# Patient Record
Sex: Female | Born: 1935 | Race: White | Hispanic: No | Smoking: Never smoker
Health system: Southern US, Community
[De-identification: ages and names within clinical notes are randomized; demographics above are authoritative.]

## PROBLEM LIST (undated history)

## (undated) DIAGNOSIS — G20A1 Parkinson's disease without dyskinesia, without mention of fluctuations: Secondary | ICD-10-CM

## (undated) DIAGNOSIS — G20 Parkinson's disease: Secondary | ICD-10-CM

## (undated) DIAGNOSIS — F0390 Unspecified dementia without behavioral disturbance: Secondary | ICD-10-CM

---

## 2001-10-21 ENCOUNTER — Encounter: Payer: Self-pay | Admitting: Emergency Medicine

## 2001-10-21 ENCOUNTER — Emergency Department (HOSPITAL_COMMUNITY): Admission: EM | Admit: 2001-10-21 | Discharge: 2001-10-21 | Payer: Self-pay | Admitting: Emergency Medicine

## 2002-03-14 ENCOUNTER — Encounter: Payer: Self-pay | Admitting: Emergency Medicine

## 2002-03-14 ENCOUNTER — Emergency Department (HOSPITAL_COMMUNITY): Admission: EM | Admit: 2002-03-14 | Discharge: 2002-03-14 | Payer: Self-pay | Admitting: Emergency Medicine

## 2003-03-09 ENCOUNTER — Emergency Department (HOSPITAL_COMMUNITY): Admission: EM | Admit: 2003-03-09 | Discharge: 2003-03-10 | Payer: Self-pay | Admitting: Emergency Medicine

## 2003-03-09 ENCOUNTER — Encounter: Payer: Self-pay | Admitting: Emergency Medicine

## 2003-04-08 ENCOUNTER — Ambulatory Visit (HOSPITAL_COMMUNITY): Admission: RE | Admit: 2003-04-08 | Discharge: 2003-04-09 | Payer: Self-pay | Admitting: Orthopedic Surgery

## 2003-04-08 ENCOUNTER — Encounter: Payer: Self-pay | Admitting: Orthopedic Surgery

## 2006-06-10 ENCOUNTER — Encounter: Admission: RE | Admit: 2006-06-10 | Discharge: 2006-07-10 | Payer: Self-pay | Admitting: Neurology

## 2006-06-15 ENCOUNTER — Encounter: Admission: RE | Admit: 2006-06-15 | Discharge: 2006-06-15 | Payer: Self-pay | Admitting: Internal Medicine

## 2006-07-11 ENCOUNTER — Encounter: Admission: RE | Admit: 2006-07-11 | Discharge: 2006-08-09 | Payer: Self-pay | Admitting: Neurology

## 2006-08-10 ENCOUNTER — Encounter: Admission: RE | Admit: 2006-08-10 | Discharge: 2006-09-13 | Payer: Self-pay | Admitting: Neurology

## 2006-10-25 ENCOUNTER — Encounter: Admission: RE | Admit: 2006-10-25 | Discharge: 2006-11-23 | Payer: Self-pay | Admitting: Neurology

## 2006-11-24 ENCOUNTER — Encounter: Admission: RE | Admit: 2006-11-24 | Discharge: 2006-12-21 | Payer: Self-pay | Admitting: Neurology

## 2007-06-22 ENCOUNTER — Encounter: Admission: RE | Admit: 2007-06-22 | Discharge: 2007-09-20 | Payer: Self-pay | Admitting: Neurology

## 2007-07-04 ENCOUNTER — Encounter: Admission: RE | Admit: 2007-07-04 | Discharge: 2007-07-04 | Payer: Self-pay | Admitting: Internal Medicine

## 2007-07-24 ENCOUNTER — Encounter: Admission: RE | Admit: 2007-07-24 | Discharge: 2007-08-23 | Payer: Self-pay | Admitting: Neurology

## 2008-02-13 ENCOUNTER — Emergency Department (HOSPITAL_COMMUNITY): Admission: EM | Admit: 2008-02-13 | Discharge: 2008-02-14 | Payer: Self-pay | Admitting: Emergency Medicine

## 2008-03-20 ENCOUNTER — Emergency Department (HOSPITAL_COMMUNITY): Admission: EM | Admit: 2008-03-20 | Discharge: 2008-03-20 | Payer: Self-pay | Admitting: Family Medicine

## 2008-03-27 ENCOUNTER — Emergency Department (HOSPITAL_COMMUNITY): Admission: EM | Admit: 2008-03-27 | Discharge: 2008-03-27 | Payer: Self-pay | Admitting: Emergency Medicine

## 2008-10-16 ENCOUNTER — Encounter: Admission: RE | Admit: 2008-10-16 | Discharge: 2008-10-16 | Payer: Self-pay | Admitting: Internal Medicine

## 2009-07-16 ENCOUNTER — Encounter: Admission: RE | Admit: 2009-07-16 | Discharge: 2009-09-02 | Payer: Self-pay | Admitting: Neurology

## 2009-08-22 ENCOUNTER — Emergency Department (HOSPITAL_COMMUNITY): Admission: EM | Admit: 2009-08-22 | Discharge: 2009-08-22 | Payer: Self-pay | Admitting: Emergency Medicine

## 2010-10-19 ENCOUNTER — Emergency Department (HOSPITAL_COMMUNITY): Admission: EM | Admit: 2010-10-19 | Discharge: 2010-10-19 | Payer: Self-pay | Admitting: Family Medicine

## 2010-10-26 ENCOUNTER — Emergency Department (HOSPITAL_COMMUNITY): Admission: EM | Admit: 2010-10-26 | Discharge: 2010-10-26 | Payer: Self-pay | Admitting: Emergency Medicine

## 2011-01-17 ENCOUNTER — Encounter: Payer: Self-pay | Admitting: Internal Medicine

## 2011-04-03 LAB — COMPREHENSIVE METABOLIC PANEL
ALT: <8 U/L (ref 0–35)
AST: 33 U/L (ref 0–37)
Albumin: 3.7 g/dL (ref 3.5–5.2)
Chloride: 101 mEq/L (ref 96–112)
GFR calc Af Amer: >60 mL/min (ref >60)
Glucose, Bld: 93 mg/dL (ref 70–99)
Total Bilirubin: 0.6 mg/dL (ref 0.3–1.2)

## 2011-04-03 LAB — DIFFERENTIAL
Basophils Absolute: 0 10*3/uL (ref 0.0–0.1)
Eosinophils Absolute: 0.3 10*3/uL (ref 0.0–0.7)
Lymphs Abs: 1.2 10*3/uL (ref 0.7–4.0)
Monocytes Absolute: 0.4 10*3/uL (ref 0.1–1.0)
Monocytes Relative: 6 % (ref 3–12)

## 2011-04-03 LAB — URINALYSIS, ROUTINE W REFLEX MICROSCOPIC
Glucose, UA: NEGATIVE mg/dL
Nitrite: NEGATIVE
Urobilinogen, UA: 0.2 mg/dL (ref 0.0–1.0)
pH: 7 (ref 5.0–8.0)

## 2011-04-03 LAB — CBC
Hemoglobin: 12.8 g/dL (ref 12.0–15.0)
RBC: 4.02 MIL/uL (ref 3.87–5.11)
RDW: 13.4 % (ref 11.5–15.5)
WBC: 7 10*3/uL (ref 4.0–10.5)

## 2011-04-09 ENCOUNTER — Emergency Department (HOSPITAL_COMMUNITY): Payer: Medicare Other

## 2011-04-09 ENCOUNTER — Inpatient Hospital Stay (HOSPITAL_COMMUNITY): Payer: Medicare Other

## 2011-04-09 ENCOUNTER — Inpatient Hospital Stay (HOSPITAL_COMMUNITY)
Admission: EM | Admit: 2011-04-09 | Discharge: 2011-04-29 | DRG: 280 | Disposition: A | Discharge status: Home Health | Payer: Medicare Other | Attending: Pulmonary Disease | Admitting: Pulmonary Disease

## 2011-04-09 DIAGNOSIS — J9600 Acute respiratory failure, unspecified whether with hypoxia or hypercapnia: Secondary | ICD-10-CM | POA: Diagnosis present

## 2011-04-09 DIAGNOSIS — D638 Anemia in other chronic diseases classified elsewhere: Secondary | ICD-10-CM | POA: Diagnosis present

## 2011-04-09 DIAGNOSIS — J9383 Other pneumothorax: Secondary | ICD-10-CM | POA: Diagnosis present

## 2011-04-09 DIAGNOSIS — I498 Other specified cardiac arrhythmias: Secondary | ICD-10-CM

## 2011-04-09 DIAGNOSIS — I219 Acute myocardial infarction, unspecified: Principal | ICD-10-CM | POA: Diagnosis present

## 2011-04-09 DIAGNOSIS — R579 Shock, unspecified: Secondary | ICD-10-CM

## 2011-04-09 DIAGNOSIS — R5381 Other malaise: Secondary | ICD-10-CM | POA: Diagnosis present

## 2011-04-09 DIAGNOSIS — E872 Acidosis, unspecified: Secondary | ICD-10-CM | POA: Diagnosis present

## 2011-04-09 DIAGNOSIS — R1310 Dysphagia, unspecified: Secondary | ICD-10-CM | POA: Diagnosis not present

## 2011-04-09 DIAGNOSIS — A419 Sepsis, unspecified organism: Secondary | ICD-10-CM | POA: Diagnosis present

## 2011-04-09 DIAGNOSIS — G931 Anoxic brain damage, not elsewhere classified: Secondary | ICD-10-CM | POA: Diagnosis present

## 2011-04-09 DIAGNOSIS — F0280 Dementia in other diseases classified elsewhere without behavioral disturbance: Secondary | ICD-10-CM | POA: Diagnosis present

## 2011-04-09 DIAGNOSIS — I442 Atrioventricular block, complete: Secondary | ICD-10-CM | POA: Diagnosis present

## 2011-04-09 DIAGNOSIS — I469 Cardiac arrest, cause unspecified: Secondary | ICD-10-CM

## 2011-04-09 DIAGNOSIS — Z950 Presence of cardiac pacemaker: Secondary | ICD-10-CM

## 2011-04-09 DIAGNOSIS — E870 Hyperosmolality and hypernatremia: Secondary | ICD-10-CM | POA: Diagnosis present

## 2011-04-09 DIAGNOSIS — J690 Pneumonitis due to inhalation of food and vomit: Secondary | ICD-10-CM | POA: Diagnosis present

## 2011-04-09 DIAGNOSIS — N179 Acute kidney failure, unspecified: Secondary | ICD-10-CM | POA: Diagnosis present

## 2011-04-09 DIAGNOSIS — R6521 Severe sepsis with septic shock: Secondary | ICD-10-CM | POA: Diagnosis present

## 2011-04-09 LAB — DIFFERENTIAL
Basophils Absolute: 0 10*3/uL (ref 0.0–0.1)
Basophils Relative: 0 % (ref 0–1)
Eosinophils Absolute: 0.1 10*3/uL (ref 0.0–0.7)
Eosinophils Relative: 1 % (ref 0–5)
Lymphocytes Relative: 9 % — ABNORMAL LOW (ref 12–46)
Monocytes Absolute: 0.6 10*3/uL (ref 0.1–1.0)
Neutro Abs: 10.1 10*3/uL — ABNORMAL HIGH (ref 1.7–7.7)

## 2011-04-09 LAB — BASIC METABOLIC PANEL
BUN: 50 mg/dL — ABNORMAL HIGH (ref 6–23)
CO2: 19 mEq/L (ref 19–32)
Calcium: 8.5 mg/dL (ref 8.4–10.5)
Creatinine, Ser: 1.44 mg/dL — ABNORMAL HIGH (ref 0.4–1.2)
GFR calc Af Amer: 43 mL/min — ABNORMAL LOW (ref >60)
Glucose, Bld: 169 mg/dL — ABNORMAL HIGH (ref 70–99)

## 2011-04-09 LAB — LIPASE, BLOOD: Lipase: 23 U/L (ref 11–59)

## 2011-04-09 LAB — POCT I-STAT 3, ART BLOOD GAS (G3+)
Bicarbonate: 18.2 mEq/L — ABNORMAL LOW (ref 20.0–24.0)
O2 Saturation: 99 %
Patient temperature: 98.6
pH, Arterial: 7.268 — ABNORMAL LOW (ref 7.350–7.400)

## 2011-04-09 LAB — PHOSPHORUS: Phosphorus: 5.2 mg/dL — ABNORMAL HIGH (ref 2.3–4.6)

## 2011-04-09 LAB — CBC
Hemoglobin: 9.2 g/dL — ABNORMAL LOW (ref 12.0–15.0)
MCH: 30.5 pg (ref 26.0–34.0)

## 2011-04-09 LAB — LACTIC ACID, PLASMA: Lactic Acid, Venous: 4.6 mmol/L — ABNORMAL HIGH (ref 0.5–2.2)

## 2011-04-09 LAB — CK TOTAL AND CKMB (NOT AT ARMC): Total CK: 952 U/L — ABNORMAL HIGH (ref 7–177)

## 2011-04-09 LAB — BRAIN NATRIURETIC PEPTIDE: Pro B Natriuretic peptide (BNP): 774 pg/mL — ABNORMAL HIGH (ref 0.0–100.0)

## 2011-04-09 LAB — GLUCOSE, CAPILLARY: Glucose-Capillary: 60 mg/dL — ABNORMAL LOW (ref 70–99)

## 2011-04-10 ENCOUNTER — Inpatient Hospital Stay (HOSPITAL_COMMUNITY): Payer: Medicare Other

## 2011-04-10 LAB — BLOOD GAS, ARTERIAL
Bicarbonate: 20.2 mEq/L (ref 20.0–24.0)
Drawn by: 10006
Drawn by: 10006
FIO2: 80 %
O2 Saturation: 99.4 %
PEEP: 10 cmH2O
PEEP: 10 cmH2O
RATE: 28 resp/min
pCO2 arterial: 30.9 mmHg — ABNORMAL LOW (ref 35.0–45.0)
pCO2 arterial: 32.9 mmHg — ABNORMAL LOW (ref 35.0–45.0)
pH, Arterial: 7.403 — ABNORMAL HIGH (ref 7.350–7.400)
pO2, Arterial: 95 mmHg (ref 80.0–100.0)
pO2, Arterial: 133 mmHg — ABNORMAL HIGH (ref 80.0–100.0)

## 2011-04-10 LAB — BASIC METABOLIC PANEL
BUN: 48 mg/dL — ABNORMAL HIGH (ref 6–23)
BUN: 52 mg/dL — ABNORMAL HIGH (ref 6–23)
CO2: 19 mEq/L (ref 19–32)
CO2: 19 mEq/L (ref 19–32)
Calcium: 7.5 mg/dL — ABNORMAL LOW (ref 8.4–10.5)
Chloride: 109 mEq/L (ref 96–112)
Creatinine, Ser: 1.39 mg/dL — ABNORMAL HIGH (ref 0.4–1.2)
GFR calc Af Amer: 45 mL/min — ABNORMAL LOW (ref >60)
Glucose, Bld: 122 mg/dL — ABNORMAL HIGH (ref 70–99)
Potassium: 5.3 mEq/L — ABNORMAL HIGH (ref 3.5–5.1)

## 2011-04-10 LAB — URINALYSIS, ROUTINE W REFLEX MICROSCOPIC
Protein, ur: >300 mg/dL — AB
Urobilinogen, UA: 1 mg/dL (ref 0.0–1.0)

## 2011-04-10 LAB — CBC
HCT: 25.1 % — ABNORMAL LOW (ref 36.0–46.0)
MCH: 30.1 pg (ref 26.0–34.0)
MCHC: 32.7 g/dL (ref 30.0–36.0)
RDW: 19.8 % — ABNORMAL HIGH (ref 11.5–15.5)

## 2011-04-10 LAB — URINE MICROSCOPIC-ADD ON

## 2011-04-10 LAB — CARDIAC PANEL(CRET KIN+CKTOT+MB+TROPI)
CK, MB: 50.4 ng/mL (ref 0.3–4.0)
CK, MB: 33.3 ng/mL (ref 0.3–4.0)
Relative Index: 8.2 — ABNORMAL HIGH (ref 0.0–2.5)
Relative Index: 6.6 — ABNORMAL HIGH (ref 0.0–2.5)
Total CK: 506 U/L — ABNORMAL HIGH (ref 7–177)
Troponin I: 0.24 ng/mL — ABNORMAL HIGH (ref 0.00–0.06)

## 2011-04-10 LAB — MAGNESIUM: Magnesium: 2.3 mg/dL (ref 1.5–2.5)

## 2011-04-10 LAB — GLUCOSE, CAPILLARY: Glucose-Capillary: 119 mg/dL — ABNORMAL HIGH (ref 70–99)

## 2011-04-10 LAB — PHOSPHORUS: Phosphorus: 3.8 mg/dL (ref 2.3–4.6)

## 2011-04-11 ENCOUNTER — Inpatient Hospital Stay (HOSPITAL_COMMUNITY): Payer: Medicare Other

## 2011-04-11 DIAGNOSIS — I319 Disease of pericardium, unspecified: Secondary | ICD-10-CM

## 2011-04-11 DIAGNOSIS — J9600 Acute respiratory failure, unspecified whether with hypoxia or hypercapnia: Secondary | ICD-10-CM

## 2011-04-11 DIAGNOSIS — R579 Shock, unspecified: Secondary | ICD-10-CM

## 2011-04-11 DIAGNOSIS — I469 Cardiac arrest, cause unspecified: Secondary | ICD-10-CM

## 2011-04-11 LAB — CBC
MCH: 30.6 pg (ref 26.0–34.0)
MCV: 93.1 fL (ref 78.0–100.0)
Platelets: 146 10*3/uL — ABNORMAL LOW (ref 150–400)
RBC: 2.48 MIL/uL — ABNORMAL LOW (ref 3.87–5.11)

## 2011-04-11 LAB — BASIC METABOLIC PANEL
BUN: 57 mg/dL — ABNORMAL HIGH (ref 6–23)
BUN: 54 mg/dL — ABNORMAL HIGH (ref 6–23)
CO2: 19 mEq/L (ref 19–32)
CO2: 20 mEq/L (ref 19–32)
Calcium: 7.2 mg/dL — ABNORMAL LOW (ref 8.4–10.5)
Chloride: 108 mEq/L (ref 96–112)
Chloride: 108 mEq/L (ref 96–112)
Creatinine, Ser: 1.87 mg/dL — ABNORMAL HIGH (ref 0.4–1.2)
Creatinine, Ser: 1.64 mg/dL — ABNORMAL HIGH (ref 0.4–1.2)
GFR calc Af Amer: 32 mL/min — ABNORMAL LOW (ref >60)
GFR calc non Af Amer: 30 mL/min — ABNORMAL LOW (ref >60)
Potassium: 4.7 mEq/L (ref 3.5–5.1)
Sodium: 135 mEq/L (ref 135–145)

## 2011-04-11 LAB — BLOOD GAS, ARTERIAL
Bicarbonate: 20.1 mEq/L (ref 20.0–24.0)
MECHVT: 360 mL
O2 Saturation: 99.6 %
PEEP: 5 cmH2O
Patient temperature: 98.6
RATE: 28 resp/min

## 2011-04-11 LAB — LEGIONELLA ANTIGEN, URINE: Legionella Antigen, Urine: NEGATIVE

## 2011-04-11 LAB — MAGNESIUM: Magnesium: 2.4 mg/dL (ref 1.5–2.5)

## 2011-04-11 LAB — GLUCOSE, CAPILLARY
Glucose-Capillary: 106 mg/dL — ABNORMAL HIGH (ref 70–99)
Glucose-Capillary: 122 mg/dL — ABNORMAL HIGH (ref 70–99)
Glucose-Capillary: 95 mg/dL (ref 70–99)
Glucose-Capillary: 90 mg/dL (ref 70–99)

## 2011-04-11 LAB — URINE CULTURE

## 2011-04-12 ENCOUNTER — Inpatient Hospital Stay (HOSPITAL_COMMUNITY): Payer: Medicare Other

## 2011-04-12 DIAGNOSIS — N179 Acute kidney failure, unspecified: Secondary | ICD-10-CM

## 2011-04-12 LAB — POCT I-STAT 3, ART BLOOD GAS (G3+)
Bicarbonate: 24.6 mEq/L — ABNORMAL HIGH (ref 20.0–24.0)
O2 Saturation: 99 %
Patient temperature: 98.2
TCO2: 26 mmol/L (ref 0–100)
pCO2 arterial: 39.2 mmHg (ref 35.0–45.0)
pH, Arterial: 7.405 — ABNORMAL HIGH (ref 7.350–7.400)
pO2, Arterial: 119 mmHg — ABNORMAL HIGH (ref 80.0–100.0)

## 2011-04-12 LAB — BASIC METABOLIC PANEL
BUN: 46 mg/dL — ABNORMAL HIGH (ref 6–23)
BUN: 50 mg/dL — ABNORMAL HIGH (ref 6–23)
CO2: 25 mEq/L (ref 19–32)
CO2: 25 mEq/L (ref 19–32)
Calcium: 7.2 mg/dL — ABNORMAL LOW (ref 8.4–10.5)
Calcium: 7.4 mg/dL — ABNORMAL LOW (ref 8.4–10.5)
Chloride: 107 mEq/L (ref 96–112)
Creatinine, Ser: 1.44 mg/dL — ABNORMAL HIGH (ref 0.4–1.2)
Creatinine, Ser: 1.53 mg/dL — ABNORMAL HIGH (ref 0.4–1.2)
GFR calc Af Amer: 43 mL/min — ABNORMAL LOW (ref >60)
GFR calc non Af Amer: 35 mL/min — ABNORMAL LOW (ref >60)
Glucose, Bld: 128 mg/dL — ABNORMAL HIGH (ref 70–99)
Glucose, Bld: 130 mg/dL — ABNORMAL HIGH (ref 70–99)
Potassium: 3.6 mEq/L (ref 3.5–5.1)
Sodium: 141 mEq/L (ref 135–145)

## 2011-04-12 LAB — ABO/RH: ABO/RH(D): O POS

## 2011-04-12 LAB — CULTURE, BLOOD (ROUTINE X 2): Culture  Setup Time: 201204140114

## 2011-04-12 LAB — CBC
HCT: 21.2 % — ABNORMAL LOW (ref 36.0–46.0)
Hemoglobin: 6.9 g/dL — CL (ref 12.0–15.0)
MCHC: 32.5 g/dL (ref 30.0–36.0)
MCV: 94.2 fL (ref 78.0–100.0)
RDW: 20.2 % — ABNORMAL HIGH (ref 11.5–15.5)

## 2011-04-12 LAB — CULTURE, RESPIRATORY W GRAM STAIN

## 2011-04-12 LAB — MAGNESIUM: Magnesium: 1.8 mg/dL (ref 1.5–2.5)

## 2011-04-12 LAB — PHOSPHORUS: Phosphorus: 3.6 mg/dL (ref 2.3–4.6)

## 2011-04-12 LAB — GLUCOSE, CAPILLARY
Glucose-Capillary: 125 mg/dL — ABNORMAL HIGH (ref 70–99)
Glucose-Capillary: 131 mg/dL — ABNORMAL HIGH (ref 70–99)
Glucose-Capillary: 137 mg/dL — ABNORMAL HIGH (ref 70–99)
Glucose-Capillary: 132 mg/dL — ABNORMAL HIGH (ref 70–99)

## 2011-04-13 ENCOUNTER — Inpatient Hospital Stay (HOSPITAL_COMMUNITY): Payer: Medicare Other

## 2011-04-13 LAB — CROSSMATCH: Unit division: 0

## 2011-04-13 LAB — CBC
MCV: 93.3 fL (ref 78.0–100.0)
Platelets: 124 10*3/uL — ABNORMAL LOW (ref 150–400)
RBC: 2.84 MIL/uL — ABNORMAL LOW (ref 3.87–5.11)
RDW: 20.3 % — ABNORMAL HIGH (ref 11.5–15.5)
WBC: 9.8 10*3/uL (ref 4.0–10.5)

## 2011-04-13 LAB — BASIC METABOLIC PANEL
BUN: 36 mg/dL — ABNORMAL HIGH (ref 6–23)
Chloride: 110 mEq/L (ref 96–112)
GFR calc Af Amer: >60 mL/min (ref >60)
GFR calc non Af Amer: 56 mL/min — ABNORMAL LOW (ref >60)
Potassium: 3.8 mEq/L (ref 3.5–5.1)
Sodium: 145 mEq/L (ref 135–145)

## 2011-04-13 LAB — GLUCOSE, CAPILLARY
Glucose-Capillary: 121 mg/dL — ABNORMAL HIGH (ref 70–99)
Glucose-Capillary: 134 mg/dL — ABNORMAL HIGH (ref 70–99)
Glucose-Capillary: 122 mg/dL — ABNORMAL HIGH (ref 70–99)

## 2011-04-13 NOTE — Procedures (Signed)
EEG NUMBER:  HISTORY:  A 77 year old female, poorly responsive on the ventilator.  MEDICATIONS:  Protonix, NovoLog, potassium, Lasix, Zithromax, ProSource, fentanyl, and versed.  CONDITIONS OF RECORDING:  This is a 16-channel EEG carried out with the patient in the poorly responsive state.  DESCRIPTION:  Muscle and movement artifact are prominent during the tracing.  This muscle and movement artifact is superimposed on what seems to be a polymorphic delta activity that is noted bilaterally in all quadrants.  There are rare occasions when this polymorphic delta activity reaches a maximum of 4-5 Hz.  No sleep transients are noted. Hypoventilation was not performed.  Intermittent photic stimulation failed to list any change in the tracing.  IMPRESSION AND PLAN:  This is an abnormal EEG secondary to a general background slowing.  This finding is consistent with a diffuse disturbance that is etiologically nonspecific, but may include a metabolic encephalopathy among other possibilities.          ______________________________ Thana Farr, MD    ZO:[id number] D:  04/12/2011 17:13:36  T:  04/13/2011 03:13:00  Job #:  [id number]

## 2011-04-14 ENCOUNTER — Inpatient Hospital Stay (HOSPITAL_COMMUNITY): Payer: Medicare Other

## 2011-04-14 LAB — BASIC METABOLIC PANEL
Chloride: 111 mEq/L (ref 96–112)
GFR calc Af Amer: >60 mL/min (ref >60)
Potassium: 3.8 mEq/L (ref 3.5–5.1)
Sodium: 146 mEq/L — ABNORMAL HIGH (ref 135–145)

## 2011-04-14 LAB — MAGNESIUM: Magnesium: 1.9 mg/dL (ref 1.5–2.5)

## 2011-04-14 LAB — CBC
Platelets: 120 10*3/uL — ABNORMAL LOW (ref 150–400)
RBC: 2.72 MIL/uL — ABNORMAL LOW (ref 3.87–5.11)
WBC: 8.7 10*3/uL (ref 4.0–10.5)

## 2011-04-14 LAB — DIFFERENTIAL
Basophils Absolute: 0 10*3/uL (ref 0.0–0.1)
Basophils Relative: 0 % (ref 0–1)
Eosinophils Absolute: 0.3 10*3/uL (ref 0.0–0.7)
Lymphs Abs: 0.7 10*3/uL (ref 0.7–4.0)
Neutrophils Relative %: 76 % (ref 43–77)

## 2011-04-14 LAB — PHOSPHORUS: Phosphorus: 3 mg/dL (ref 2.3–4.6)

## 2011-04-14 LAB — GLUCOSE, CAPILLARY
Glucose-Capillary: 124 mg/dL — ABNORMAL HIGH (ref 70–99)
Glucose-Capillary: 118 mg/dL — ABNORMAL HIGH (ref 70–99)

## 2011-04-15 LAB — CBC
HCT: 27 % — ABNORMAL LOW (ref 36.0–46.0)
Hemoglobin: 8.4 g/dL — ABNORMAL LOW (ref 12.0–15.0)
MCH: 29.5 pg (ref 26.0–34.0)
MCHC: 31.1 g/dL (ref 30.0–36.0)
RBC: 2.85 MIL/uL — ABNORMAL LOW (ref 3.87–5.11)

## 2011-04-15 LAB — GLUCOSE, CAPILLARY
Glucose-Capillary: 124 mg/dL — ABNORMAL HIGH (ref 70–99)
Glucose-Capillary: 129 mg/dL — ABNORMAL HIGH (ref 70–99)
Glucose-Capillary: 142 mg/dL — ABNORMAL HIGH (ref 70–99)
Glucose-Capillary: 118 mg/dL — ABNORMAL HIGH (ref 70–99)
Glucose-Capillary: 114 mg/dL — ABNORMAL HIGH (ref 70–99)

## 2011-04-15 LAB — POCT I-STAT 3, ART BLOOD GAS (G3+)
Bicarbonate: 33.5 mEq/L — ABNORMAL HIGH (ref 20.0–24.0)
TCO2: 35 mmol/L (ref 0–100)
pCO2 arterial: 40.1 mmHg (ref 35.0–45.0)
pH, Arterial: 7.53 — ABNORMAL HIGH (ref 7.350–7.400)

## 2011-04-15 LAB — PHOSPHORUS: Phosphorus: 3.5 mg/dL (ref 2.3–4.6)

## 2011-04-15 LAB — BASIC METABOLIC PANEL
CO2: 31 mEq/L (ref 19–32)
Calcium: 7.6 mg/dL — ABNORMAL LOW (ref 8.4–10.5)
Chloride: 107 mEq/L (ref 96–112)
Glucose, Bld: 122 mg/dL — ABNORMAL HIGH (ref 70–99)
Sodium: 143 mEq/L (ref 135–145)

## 2011-04-16 ENCOUNTER — Inpatient Hospital Stay (HOSPITAL_COMMUNITY): Payer: Medicare Other

## 2011-04-16 LAB — BASIC METABOLIC PANEL
BUN: 31 mg/dL — ABNORMAL HIGH (ref 6–23)
CO2: 34 mEq/L — ABNORMAL HIGH (ref 19–32)
Calcium: 8 mg/dL — ABNORMAL LOW (ref 8.4–10.5)
Chloride: 103 mEq/L (ref 96–112)
Creatinine, Ser: 0.9 mg/dL (ref 0.4–1.2)
GFR calc Af Amer: >60 mL/min (ref >60)
Glucose, Bld: 109 mg/dL — ABNORMAL HIGH (ref 70–99)

## 2011-04-16 LAB — CULTURE, BLOOD (ROUTINE X 2)
Culture  Setup Time: 201204140114
Culture: NO GROWTH

## 2011-04-16 LAB — CBC
HCT: 28.8 % — ABNORMAL LOW (ref 36.0–46.0)
Hemoglobin: 8.8 g/dL — ABNORMAL LOW (ref 12.0–15.0)
MCH: 29.5 pg (ref 26.0–34.0)
MCHC: 30.6 g/dL (ref 30.0–36.0)
MCV: 96.6 fL (ref 78.0–100.0)
RBC: 2.98 MIL/uL — ABNORMAL LOW (ref 3.87–5.11)

## 2011-04-16 LAB — DIFFERENTIAL
Basophils Relative: 0 % (ref 0–1)
Lymphs Abs: 1.1 10*3/uL (ref 0.7–4.0)
Monocytes Absolute: 0.8 10*3/uL (ref 0.1–1.0)
Monocytes Relative: 8 % (ref 3–12)
Neutro Abs: 7.8 10*3/uL — ABNORMAL HIGH (ref 1.7–7.7)

## 2011-04-16 LAB — GLUCOSE, CAPILLARY: Glucose-Capillary: 116 mg/dL — ABNORMAL HIGH (ref 70–99)

## 2011-04-17 ENCOUNTER — Inpatient Hospital Stay (HOSPITAL_COMMUNITY): Payer: Medicare Other

## 2011-04-17 LAB — DIFFERENTIAL
Basophils Absolute: 0 10*3/uL (ref 0.0–0.1)
Lymphocytes Relative: 10 % — ABNORMAL LOW (ref 12–46)
Monocytes Absolute: 0.7 10*3/uL (ref 0.1–1.0)
Monocytes Relative: 7 % (ref 3–12)
Neutro Abs: 8.3 10*3/uL — ABNORMAL HIGH (ref 1.7–7.7)

## 2011-04-17 LAB — CBC
HCT: 29.2 % — ABNORMAL LOW (ref 36.0–46.0)
Hemoglobin: 9 g/dL — ABNORMAL LOW (ref 12.0–15.0)
MCHC: 30.8 g/dL (ref 30.0–36.0)

## 2011-04-17 LAB — GLUCOSE, CAPILLARY
Glucose-Capillary: 119 mg/dL — ABNORMAL HIGH (ref 70–99)
Glucose-Capillary: 110 mg/dL — ABNORMAL HIGH (ref 70–99)

## 2011-04-17 LAB — BRAIN NATRIURETIC PEPTIDE: Pro B Natriuretic peptide (BNP): 229 pg/mL — ABNORMAL HIGH (ref 0.0–100.0)

## 2011-04-17 LAB — BASIC METABOLIC PANEL
CO2: 36 mEq/L — ABNORMAL HIGH (ref 19–32)
Calcium: 8.1 mg/dL — ABNORMAL LOW (ref 8.4–10.5)
Glucose, Bld: 124 mg/dL — ABNORMAL HIGH (ref 70–99)
Sodium: 142 mEq/L (ref 135–145)

## 2011-04-18 ENCOUNTER — Inpatient Hospital Stay (HOSPITAL_COMMUNITY): Payer: Medicare Other

## 2011-04-18 DIAGNOSIS — N179 Acute kidney failure, unspecified: Secondary | ICD-10-CM

## 2011-04-18 LAB — GLUCOSE, CAPILLARY
Glucose-Capillary: 105 mg/dL — ABNORMAL HIGH (ref 70–99)
Glucose-Capillary: 113 mg/dL — ABNORMAL HIGH (ref 70–99)
Glucose-Capillary: 114 mg/dL — ABNORMAL HIGH (ref 70–99)
Glucose-Capillary: 115 mg/dL — ABNORMAL HIGH (ref 70–99)
Glucose-Capillary: 126 mg/dL — ABNORMAL HIGH (ref 70–99)
Glucose-Capillary: 159 mg/dL — ABNORMAL HIGH (ref 70–99)

## 2011-04-18 LAB — DIFFERENTIAL
Eosinophils Absolute: 0.4 10*3/uL (ref 0.0–0.7)
Eosinophils Relative: 4 % (ref 0–5)
Lymphs Abs: 0.7 10*3/uL (ref 0.7–4.0)

## 2011-04-18 LAB — CBC
MCV: 96.6 fL (ref 78.0–100.0)
Platelets: 198 10*3/uL (ref 150–400)
RDW: 19.2 % — ABNORMAL HIGH (ref 11.5–15.5)
WBC: 10.7 10*3/uL — ABNORMAL HIGH (ref 4.0–10.5)

## 2011-04-18 LAB — BASIC METABOLIC PANEL
BUN: 36 mg/dL — ABNORMAL HIGH (ref 6–23)
Creatinine, Ser: 1.02 mg/dL (ref 0.4–1.2)
GFR calc non Af Amer: 53 mL/min — ABNORMAL LOW (ref >60)
Potassium: 4.9 mEq/L (ref 3.5–5.1)

## 2011-04-19 ENCOUNTER — Inpatient Hospital Stay (HOSPITAL_COMMUNITY): Payer: Medicare Other

## 2011-04-19 LAB — GLUCOSE, CAPILLARY
Glucose-Capillary: 104 mg/dL — ABNORMAL HIGH (ref 70–99)
Glucose-Capillary: 131 mg/dL — ABNORMAL HIGH (ref 70–99)
Glucose-Capillary: 143 mg/dL — ABNORMAL HIGH (ref 70–99)
Glucose-Capillary: 123 mg/dL — ABNORMAL HIGH (ref 70–99)

## 2011-04-19 LAB — BASIC METABOLIC PANEL
CO2: 34 mEq/L — ABNORMAL HIGH (ref 19–32)
Chloride: 101 mEq/L (ref 96–112)
GFR calc Af Amer: >60 mL/min (ref >60)
Potassium: 4.4 mEq/L (ref 3.5–5.1)
Sodium: 142 mEq/L (ref 135–145)

## 2011-04-19 LAB — CBC
Hemoglobin: 8.7 g/dL — ABNORMAL LOW (ref 12.0–15.0)
RBC: 2.85 MIL/uL — ABNORMAL LOW (ref 3.87–5.11)
WBC: 9.1 10*3/uL (ref 4.0–10.5)

## 2011-04-20 ENCOUNTER — Inpatient Hospital Stay (HOSPITAL_COMMUNITY): Payer: Medicare Other

## 2011-04-20 LAB — GLUCOSE, CAPILLARY
Glucose-Capillary: 122 mg/dL — ABNORMAL HIGH (ref 70–99)
Glucose-Capillary: 109 mg/dL — ABNORMAL HIGH (ref 70–99)
Glucose-Capillary: 109 mg/dL — ABNORMAL HIGH (ref 70–99)

## 2011-04-20 LAB — CBC
HCT: 27.4 % — ABNORMAL LOW (ref 36.0–46.0)
Hemoglobin: 8.5 g/dL — ABNORMAL LOW (ref 12.0–15.0)
MCV: 97.9 fL (ref 78.0–100.0)
RDW: 19.1 % — ABNORMAL HIGH (ref 11.5–15.5)
WBC: 8.9 10*3/uL (ref 4.0–10.5)

## 2011-04-20 LAB — DIFFERENTIAL
Eosinophils Relative: 4 % (ref 0–5)
Lymphocytes Relative: 11 % — ABNORMAL LOW (ref 12–46)
Lymphs Abs: 1 10*3/uL (ref 0.7–4.0)
Monocytes Absolute: 0.7 10*3/uL (ref 0.1–1.0)
Neutro Abs: 6.8 10*3/uL (ref 1.7–7.7)

## 2011-04-20 LAB — BASIC METABOLIC PANEL
BUN: 37 mg/dL — ABNORMAL HIGH (ref 6–23)
CO2: 35 mEq/L — ABNORMAL HIGH (ref 19–32)
GFR calc non Af Amer: 57 mL/min — ABNORMAL LOW (ref >60)
Glucose, Bld: 119 mg/dL — ABNORMAL HIGH (ref 70–99)
Potassium: 4.2 mEq/L (ref 3.5–5.1)

## 2011-04-21 ENCOUNTER — Inpatient Hospital Stay (HOSPITAL_COMMUNITY): Payer: Medicare Other

## 2011-04-21 LAB — BASIC METABOLIC PANEL
CO2: 35 mEq/L — ABNORMAL HIGH (ref 19–32)
Calcium: 8.4 mg/dL (ref 8.4–10.5)
Chloride: 105 mEq/L (ref 96–112)
Creatinine, Ser: 0.87 mg/dL (ref 0.4–1.2)
Glucose, Bld: 118 mg/dL — ABNORMAL HIGH (ref 70–99)

## 2011-04-21 LAB — GLUCOSE, CAPILLARY: Glucose-Capillary: 127 mg/dL — ABNORMAL HIGH (ref 70–99)

## 2011-04-21 NOTE — Consult Note (Signed)
NAME:  MURNA, BACKER NO.:  [id number]  MEDICAL RECORD NO.:  [id number]           PATIENT TYPE:  I  LOCATION:  2914                         FACILITY:  [facility]  PHYSICIAN:  Thana Farr, MD    DATE OF BIRTH:  28-Apr-1932  DATE OF CONSULTATION:  04/11/2011 DATE OF DISCHARGE:                                CONSULTATION   HISTORY:  Ms. Dolata is a 77 year old female that was on a drive with family and her caregiver when she had an syncopal episode.  The patient had no premonitory symptoms.  EMS was called.  When EMS arrived, the patient was in PEA arrest and required CPR.  She initially developed a junctional escape rhythm and then a type 2 second-degree AV block.  She then became hypotensive and required pressor.  The patient has ultimately required external pacing.  Has on initial presentation had a decreased urine output.  With return of her rhythm today, has had improvement in urine output.  Remains unresponsive.  The patient has been on sedatives for approximately 1 hour.  Consult called further recommendations.  PAST MEDICAL HISTORY:  Hypertension, depression, GERD, Parkinson disease, glaucoma and heart murmur.  MEDICATIONS AT HOME:  Celexa, Galantamine, carbidopa and levodopa, folic acid, B12, aspirin and cardia.  SOCIAL HISTORY:  The patient lives at home with a care giver.  She is widowed.  She has no history of alcohol, tobacco or illicit drug abuse.  PHYSICAL EXAMINATION:  VITAL SIGNS:  Blood pressure 135/58, heart rate 75, respiratory rate 16 and T-max 97.6. NEUROLOGIC:  On mental status testing, the patient does not respond to her name being called or loud voices in the room.  She does open her eyes to deep sternal rub, but does not focus.  With the deep sternal rub, the patient also has some grimace and her arms flex towards the center of her body as if to localize pain.  She does not follow commands.  On cranial nerve testing II, the patient  does not blink to bilateral confrontation 3, 4, 6.  Doll's eyes are intact.  Pupils are reactive bilaterally.  V and VII corneals are intact bilaterally.  On motor testing, the patient has increased tone throughout.  No spontaneous movement is noted.  On sensory test, the patient does not respond to noxious stimuli in extremity.  Deep tendon reflexes are 1+ in the upper extremities and absent in the lower extremities.  Plantars are upgoing bilaterally.  Cerebellar testing unable to be performed.  LABORATORY DATA:  Sodium of 139, potassium of 3.2, chloride of 109, bicarb of 24, BUN and creatinine of 54 and 1.64 respectively.  Glucose 124, hemoglobin and hematocrit 7.6 and 23.1 respectively.  White blood cell count 9.9, platelet count 146, calcium 7.4, magnesium 2.4, phosphorus 4.6, CK 506.  CT of the head which has been repeated shows atrophy and chronic microvascular changes, but no acute changes.  Films were reviewed.  ASSESSMENT:  Ms. Chico is a 77 year old female that presents after a cardiac arrest.  Although, she is slow to respond and may have actually incurred some  cerebral injury, she clearly does not have any findings that are consistent with brain death.  She has had some evidence of other end organ damage which may have slowed her ability to clear some of her sedatives.  This maybe delaying her waking up as well.  We will rule out the possibility of some underlying injury by performing EEG testing.  Further imaging unable to be performed since the patient does have a pacer in place.  PLAN: 1. EEG in the morning. 2. We will continue to follow with you.          ______________________________ Thana Farr, MD     LR/MEDQ  D:  04/11/2011  T:  04/12/2011  Job:  [id number]  Electronically Signed by Thana Farr MD on 04/21/2011 06:14:17 PM

## 2011-04-22 ENCOUNTER — Inpatient Hospital Stay (HOSPITAL_COMMUNITY): Payer: Medicare Other

## 2011-04-22 LAB — CBC
Hemoglobin: 9 g/dL — ABNORMAL LOW (ref 12.0–15.0)
MCH: 30 pg (ref 26.0–34.0)
MCV: 98.7 fL (ref 78.0–100.0)
RBC: 3 MIL/uL — ABNORMAL LOW (ref 3.87–5.11)
WBC: 8.6 10*3/uL (ref 4.0–10.5)

## 2011-04-22 LAB — BASIC METABOLIC PANEL
CO2: 33 mEq/L — ABNORMAL HIGH (ref 19–32)
Chloride: 106 mEq/L (ref 96–112)
Creatinine, Ser: 0.78 mg/dL (ref 0.4–1.2)
GFR calc Af Amer: >60 mL/min (ref >60)
Potassium: 4.5 mEq/L (ref 3.5–5.1)

## 2011-04-22 LAB — GLUCOSE, CAPILLARY
Glucose-Capillary: 115 mg/dL — ABNORMAL HIGH (ref 70–99)
Glucose-Capillary: 125 mg/dL — ABNORMAL HIGH (ref 70–99)

## 2011-04-23 DIAGNOSIS — R4020 Unspecified coma: Secondary | ICD-10-CM

## 2011-04-23 LAB — GLUCOSE, CAPILLARY
Glucose-Capillary: 104 mg/dL — ABNORMAL HIGH (ref 70–99)
Glucose-Capillary: 108 mg/dL — ABNORMAL HIGH (ref 70–99)
Glucose-Capillary: 123 mg/dL — ABNORMAL HIGH (ref 70–99)
Glucose-Capillary: 73 mg/dL (ref 70–99)
Glucose-Capillary: 123 mg/dL — ABNORMAL HIGH (ref 70–99)

## 2011-04-24 ENCOUNTER — Inpatient Hospital Stay (HOSPITAL_COMMUNITY): Payer: Medicare Other

## 2011-04-24 LAB — CBC
HCT: 32.1 % — ABNORMAL LOW (ref 36.0–46.0)
MCHC: 30.8 g/dL (ref 30.0–36.0)
MCV: 97.6 fL (ref 78.0–100.0)
Platelets: 294 10*3/uL (ref 150–400)
RDW: 18.5 % — ABNORMAL HIGH (ref 11.5–15.5)
WBC: 8.9 10*3/uL (ref 4.0–10.5)

## 2011-04-24 LAB — GLUCOSE, CAPILLARY: Glucose-Capillary: 110 mg/dL — ABNORMAL HIGH (ref 70–99)

## 2011-04-24 LAB — MAGNESIUM: Magnesium: 2.1 mg/dL (ref 1.5–2.5)

## 2011-04-24 LAB — BASIC METABOLIC PANEL
BUN: 38 mg/dL — ABNORMAL HIGH (ref 6–23)
Calcium: 8.2 mg/dL — ABNORMAL LOW (ref 8.4–10.5)
Creatinine, Ser: 0.91 mg/dL (ref 0.4–1.2)
GFR calc non Af Amer: >60 mL/min (ref >60)
Glucose, Bld: 118 mg/dL — ABNORMAL HIGH (ref 70–99)

## 2011-04-25 DIAGNOSIS — R4020 Unspecified coma: Secondary | ICD-10-CM

## 2011-04-25 DIAGNOSIS — N179 Acute kidney failure, unspecified: Secondary | ICD-10-CM

## 2011-04-25 DIAGNOSIS — J9600 Acute respiratory failure, unspecified whether with hypoxia or hypercapnia: Secondary | ICD-10-CM

## 2011-04-25 DIAGNOSIS — I469 Cardiac arrest, cause unspecified: Secondary | ICD-10-CM

## 2011-04-25 LAB — BASIC METABOLIC PANEL
BUN: 39 mg/dL — ABNORMAL HIGH (ref 6–23)
CO2: 32 mEq/L (ref 19–32)
Chloride: 108 mEq/L (ref 96–112)
Creatinine, Ser: 0.91 mg/dL (ref 0.4–1.2)
GFR calc Af Amer: >60 mL/min (ref >60)
Glucose, Bld: 127 mg/dL — ABNORMAL HIGH (ref 70–99)

## 2011-04-25 LAB — CBC
HCT: 31.6 % — ABNORMAL LOW (ref 36.0–46.0)
Hemoglobin: 9.7 g/dL — ABNORMAL LOW (ref 12.0–15.0)
MCH: 30 pg (ref 26.0–34.0)
MCHC: 30.7 g/dL (ref 30.0–36.0)
MCV: 97.8 fL (ref 78.0–100.0)
RBC: 3.23 MIL/uL — ABNORMAL LOW (ref 3.87–5.11)

## 2011-04-25 LAB — PHOSPHORUS: Phosphorus: 4 mg/dL (ref 2.3–4.6)

## 2011-04-26 ENCOUNTER — Inpatient Hospital Stay (HOSPITAL_COMMUNITY): Payer: Medicare Other

## 2011-04-26 DIAGNOSIS — G20 Parkinson's disease: Secondary | ICD-10-CM

## 2011-04-26 DIAGNOSIS — R627 Adult failure to thrive: Secondary | ICD-10-CM

## 2011-04-26 DIAGNOSIS — F05 Delirium due to known physiological condition: Secondary | ICD-10-CM

## 2011-04-26 DIAGNOSIS — F0390 Unspecified dementia without behavioral disturbance: Secondary | ICD-10-CM

## 2011-04-26 LAB — GLUCOSE, CAPILLARY
Glucose-Capillary: 114 mg/dL — ABNORMAL HIGH (ref 70–99)
Glucose-Capillary: 118 mg/dL — ABNORMAL HIGH (ref 70–99)
Glucose-Capillary: 104 mg/dL — ABNORMAL HIGH (ref 70–99)

## 2011-04-26 LAB — BASIC METABOLIC PANEL
Calcium: 7.7 mg/dL — ABNORMAL LOW (ref 8.4–10.5)
Chloride: 110 mEq/L (ref 96–112)
Creatinine, Ser: 0.71 mg/dL (ref 0.4–1.2)
GFR calc Af Amer: >60 mL/min (ref >60)
Sodium: 145 mEq/L (ref 135–145)

## 2011-04-27 DIAGNOSIS — F0390 Unspecified dementia without behavioral disturbance: Secondary | ICD-10-CM

## 2011-04-27 DIAGNOSIS — R627 Adult failure to thrive: Secondary | ICD-10-CM

## 2011-04-27 DIAGNOSIS — G20 Parkinson's disease: Secondary | ICD-10-CM

## 2011-04-27 DIAGNOSIS — I469 Cardiac arrest, cause unspecified: Secondary | ICD-10-CM

## 2011-04-27 NOTE — Consult Note (Addendum)
NAME:  Shawna Torres, Shawna Torres NO.:  [id number]  MEDICAL RECORD NO.:  [id number]           PATIENT TYPE:  E  LOCATION:  [facility]                         FACILITY:  [facility]  PHYSICIAN:  Bevelyn Buckles. Bensimhon, MDDATE OF BIRTH:  [birth year]  DATE OF CONSULTATION: DATE OF DISCHARGE:                                CONSULTATION   PRIMARY CARDIOLOGIST:  New.  CHIEF COMPLAINT:  Passed out.  REASON FOR CONSULTATION:  Bradycardia requiring external pacing.  HISTORY OF PRESENT ILLNESS:  Ms. Racey is a 77 year old female with no prior cardiac history.  Much of her history has been obtained from her family and her caregiver.  She has a history of Parkinson's disease, heart murmur, depression, and hypertension.  Her caregiver and her son are going to drive in the car with the patient.  Ms. Rosario was apparently conscious while getting into the car, but did complain that she was tired.  There is the car, she promptly passed out without any complaints of chest pain or shortness of breath beforehand.  Her caregiver tried to awaken her, but she was nonresponsive.  She frantically called the patient's daughter as well as 9-1-1.  She was told to lay the patient back in the seat, at which point Ms. Callaway exhibited snoring respirations.  The caregiver was instructed to listen for intermittent breathing, but was unable to hear any beyond occasional snoring breathing.  It took approximately 10 minutes for EMS to arrive at which point, they began working on her.  Apparently, per report she was in PEA arrest and required CPR.  There is report that she was initially in junctional escape rhythm, possibly developing into type 2 second-degree AV block at which point she began to get hypertensive requiring pressors.  She ultimately required external pacing to maintain adequate heart rate with pulse.  PAST MEDICAL HISTORY: 1. Hypertension. 2. Depression. 3. GERD. 4. Parkinson's disease. 5.  Glaucoma. 6. Prior history of heart murmur.  Her family states she has never had     this evaluated formally by a cardiologist.  MEDICATIONS: 1. Celexa 20 mg daily. 2. Galantamine 16 mg daily. 3. Carbidopa and levodopa 25/100 mg 1 tablet in the morning, 2 in the     afternoon, and 2 at nigh. 4. Folic acid 800 mcg daily. 5. B12 1000 mcg daily. 6. Aspirin 325 mg daily. 7. Cartia 180 mg daily.  ALLERGIES:  Neosporin.  SOCIAL HISTORY:  The patient is widowed.  She has 2 children.  She has a 25-hour caregiver.  She apparently did not smoke or drink.  FAMILY HISTORY:  Unable to obtain directly from the patient, but no known history of coronary artery disease or heart disease in the family.  REVIEW OF SYSTEMS:  Unable to obtain directly from the patient secondary mental status.  LABORATORY DATA:  WBC 12, hemoglobin 9.2, hematocrit 29.1, and platelet count 194.  Glucose 60.  STUDIES:  Chest x-ray showed left pneumonia.  Acute lateral right first rib fracture.  PHYSICAL EXAMINATION:  VITAL SIGNS:  Pulse 80, respirations 18, blood pressure 155/72, and pulse ox 89%, intubated.  GENERAL:  This is an elderly white female lying flat on an ER gurney intubated. HEENT:  Normocephalic and atraumatic with ecchymosis over her eye. NECK:  Supple. HEART:  Auscultation of the heart reveals irregular rhythm, bradycardic with soft systolic ejection murmur. LUNGS:  Sounds are coarse bilaterally. ABDOMEN:  Soft, nontender, nondistended, positive bowel sounds. EXTREMITIES:  Warm and dry and without edema. NEUROLOGIC:  She is not alert or oriented.  She does move spontaneously, but question if this is purposefully.  ASSESSMENT/PLAN:  The patient was seen and examined by Dr. Gala Romney and myself.  This is a 77 year old lady with a history of hypertension, Parkinson's disease, and no formal prior cardiac history who presents to [facility] with an episode of losing consciousness deemed to  be in PEA arrest requiring CPR.  She is critically ill and this has been described to the family.  Dr. Gala Romney was working on putting a temporary pacemaker and at this present time, further decision to be based on her clinical status.  Thank you for the opportunity to participate in the care of this patient.     Quirino Kakos, P.A.C.   ______________________________ Bevelyn Buckles. Bensimhon, MD    DD/MEDQ  D:  04/09/2011  T:  04/09/2011  Job:  [id number]  cc:   Bevelyn Buckles. Bensimhon, MD  Electronically Signed by Arvilla Meres MD on 04/27/2011 07:24:31 PM Electronically Signed by Ronie Spies  on 05/05/2011 04:24:03 PM

## 2011-04-28 LAB — GLUCOSE, CAPILLARY: Glucose-Capillary: 118 mg/dL — ABNORMAL HIGH (ref 70–99)

## 2011-04-29 LAB — BASIC METABOLIC PANEL
BUN: 42 mg/dL — ABNORMAL HIGH (ref 6–23)
Chloride: 106 mEq/L (ref 96–112)
Creatinine, Ser: 0.6 mg/dL (ref 0.4–1.2)
Glucose, Bld: 86 mg/dL (ref 70–99)
Potassium: 4.3 mEq/L (ref 3.5–5.1)

## 2011-04-30 NOTE — Discharge Summary (Addendum)
  NAME:  Shawna Torres, LAMPRON NO.:  [id number]  MEDICAL RECORD NO.:  [id number]           PATIENT TYPE:  I  LOCATION:  5527                         FACILITY:  [facility]  PHYSICIAN:  Charlcie Cradle. Delford Field, MD, FCCPDATE OF BIRTH:  08/03/1932  DATE OF ADMISSION:  04/09/2011 DATE OF DISCHARGE:  04/29/2011                              DISCHARGE SUMMARY   ADDENDUM:  She previously was scheduled with Delsa Sale at [facility].  This appointment was cancelled and per family request, she has been scheduled with Mady Gemma, PA at [facility] on Monday, May 10, 2011 at 11:20 for a followup.     Canary Brim, NP   ______________________________ Charlcie Cradle Delford Field, MD, FCCP    BO/MEDQ  D:  04/29/2011  T:  04/29/2011  Job:  [id number]  cc:   Mady Gemma, PA  Electronically Signed by Shan Levans MD FCCP on 05/05/2011 09:43:53 PM Electronically Signed by Canary Brim  on 05/12/2011 02:02:18 PM

## 2011-04-30 NOTE — Discharge Summary (Addendum)
NAME:  Shawna Torres, Shawna Torres NO.:  [id number]  MEDICAL RECORD NO.:  [id number]           PATIENT TYPE:  I  LOCATION:  5527                         FACILITY:  [facility]  PHYSICIAN:  Charlcie Cradle. Delford Field, MD, FCCPDATE OF BIRTH:  08-27-1932  DATE OF ADMISSION:  04/09/2011 DATE OF DISCHARGE:  04/29/2011                              DISCHARGE SUMMARY   DISCHARGE DIAGNOSES: 1. Status post a bradycardic pulseless electrical activity arrest. 2. Post anoxic encephalopathy. 3. Parkinson disease. 4. Dementia. 5. Pneumothorax. 6. Pneumonia. 7. Acute renal insufficiency. 8. Hypernatremia. 9. Anemia. 10.Dysphagia. 11.Severe debilitation.  CONSULTING PHYSICIANS: 1. Thana Farr, MD, of Neurology. 2. Marca Ancona, MD, of [facility]. 3. Bevelyn Buckles. Bensimhon, MD, from [facility].  LINES/TUBES: 1. Oroendotracheal tube from April 09, 2011, until April 01, 2011.  She     was reintubated on April 11, 2011, secondary to lost airway and was     extubated on April 21, 2011. 2. Left IJ from April 10, 2011, until April 21, 2011. 3. Right femoral A-line from April 09, 2011, to April 15, 2011. 4. Right IJ Cordis from April 09, 2011, until April 21, 2011. 5. Foley catheter from April 09, 2011, which will remain in place     postdischarge. 6. Right chest tube from April 09, 2011, until April 10, 2011, which     was accidentally dislodged and replaced on April 10, 2011, and     remained in place until April 20, 2011.  MICRO DATA: 1. Blood cultures x2 on April 09, 2011, demonstrate diphtheroids     Corynebacterium. 2. Urine culture from April 09, 2011, is negative. 3. Sputum culture on April 09, 2011, demonstrates normal flora. 4. Urine Legionella on April 09, 2011, is negative.  ANTIBIOTIC DATA: 1. Rocephin from April 09, 2011, to April 13, 2011. 2. Zithromax from April 09, 2011, to April 13, 2011. 3. Avelox from April 13, 2011, to April 18, 2011.  BEST PRACTICE:  The  patient was placed on Protonix for stress ulcer prophylaxis and heparin for DVT prevention.  KEY EVENTS/STUDIES: 1. April 10, 2011, the patient's code status was established as a     limited code blue. 2. April 11, 2011, CT of the head demonstrates no acute changes. 3. April 12, 2011, EEG demonstrates generalized diffuse slowing. 4. April 13, 2011, Neurology consultation feels anoxic injury of not     severe type.  The patient also had two more bradycardic episodes     with a pacer kicking in and still needing bagging during the second     episode which was associated with hypoxemia. 5. April 14, 2011, the patient continues to have daily episodes of     spontaneous bradycardia.  She is more awake and followed one to two     commands. 6. April 15, 2011, the patient's temporary pacemaker was removed.  No     more bradycardia was noted.  She did feel an SVT.  She was more     awake and beginning to follow up commands. 7. April 16, 2011, one episode of bradycardia. 8.  April 17, 2011, she weaned on pressure support ventilation for the     first time. 9. April 25, 2011, no acute events.  HISTORY OF PRESENT ILLNESS:  Shawna Torres is a 77 year old white female with the past medical history of hypertension, depression, and GERD, Parkinson disease, glaucoma, and history of a heart murmur who needed 24- hour care prior to presentation at this admission.  She did have a home assistant and they report that prior to presentation she had intermittent activity tolerance and some days, she would stay in bed and other day she was able to provide self-care such as pulling of her own Depends and walking around the house with a walker.  On the day of admission, Shawna Torres and her care giver were driving in the car and she was conscious while getting into the car but complained that she was tired.  During the car ride, she passed out without any complaints of chest pain or shortness of breath.  The  caregiver, Olegario Messier, tried to awaken her but she was nonresponsive.  She called the daughter as well as 911.  Shawna Torres exhibited snoring respirations.  It took approximately 10 minutes for EMS to arrive, at which point they began CPR.  There is discrepancy regarding initial rhythm from EMS to include possible PEA arrest versus second-degree AV block.  She ultimately required external pacing and was transferred to [facility].  At which time, she was noted to be in shock and required intubation with mechanical ventilation.  She did have a right pneumothorax which was noted on admission post CPR.  A right chest tube was placed for pneumothorax.  She was placed on ARDS protocol given bilateral infiltrates.  A temporary pacemaker was placed per Cardiology.  During intensive care stay, Shawna Torres did have multiple episodes of spontaneous bradycardia which did require use of her temporary pacemaker.  During her prolonged ICU stay, she did have over the course of time lessening in bradycardic episodes.  At the time of transition from intensive care unit, bradycardia had resolved.  Shawna Torres was covered during hospital admission for a community-acquired aspiration pneumonia.  It was thought initially that PEA arrest was likely secondary to myocardial infarction. Initially, she was completely dependent on the pacemaker.  She was also noted to have acute renal failure, metabolic acidosis on arrival.  This was likely a prerenal azotemia secondary to hypotension and shock. Initially, she was anuric.  She was aggressively hydrated initially on admit.  Post stabilization, Shawna Torres did have a prolonged period of altered mental status which was thought to be related to anoxic encephalopathy.  Her mental status did slowly improve during course of admission. Early on during hospital course, prognosis was discussed with family to be very poor.  Parameters were placed with family that the patient would be  considered initially a limited code blue and then postextubation considered no code to include no intubation and no CPR.  Hospice and palliative care were involved during hospital course.  Shawna Torres family reported upon admit that she "like to be treated like royalty." Up until October 2006, her husband was her primary care giver and used to pamper the patient from putting her clothes on and cooking her food. Her children demonstrated significant psychosocial distress from this admission and prior behavior of the patient.  They indicated they feel that the patient expects for them to treat her like royalty as well.  At the time of discharge, the family persistently want to  take Shawna Torres home despite recommendations of skilled nursing facility placement.  At this time, she will be discharged to home with maximum home benefit assistance.  She does have private duty assistance prior to presentation and will continue with this post discharge.  Please see above date for extubation and antibiotic data, etc.  She was treated for a community- acquired pneumonia which she did have one out of two blood cultures positive for diphtheroids Corynebacterium.  LABORATORY DATA:  Apr 29, 2011, BMP demonstrates sodium 142, potassium 4.3, chloride 106, CO2 is 29, glucose 86, BUN 42, creatinine 0.60, calcium 9.0.  RADIOLOGIC DATA:  April 26, 2011, a two view of the chest demonstrates no acute infiltrate, effusions, or edema.  HOSPITAL COURSE BY DISCHARGE DIAGNOSES: 1. Status post bradycardic/pulseless electrical activity arrest.  As     per HPI, Shawna Torres was riding in the car with her caregiver and son     on April 09, 2011, and indicated that she was tired and walked into     the car.  While in the car, she did lose consciousness and was     unable to be aroused.  Caregiver did call 911 for EMS assistance     which did take approximately 10-15 minutes for arrival, at which     time there was  discrepancy of initial rhythm, however, she did     require CPR and was felt to be a bradycardic PEA versus heart     block.  She was revived and transferred to [facility] Emergency     Department, at which time she was noted to be significantly     bradycardic requiring temporary pacemaker.  Initially, she was     completely dependent on pacemaker and then transitioned to     intermittent bradycardic episodes and then stable heart rhythm     without pacemaker assistance.  She did have resolution of     bradycardia post pacemaker removal. 2. Post anoxic encephalopathy.  Unfortunately secondary to CPR and     underlying Parkinson disease with poor baseline prior to     presentation, Shawna Torres does have residual mild changes in her     mental status.  She is intermittently interactive with family,     however, is very slow to respond and intermittent at best with     responses. 3. Parkinson's.  Shawna Torres does have a known history of Parkinson     disease and she was continued on her medications during hospital     course and she will continue on these postdischarge. 4. Dementia.  She does have an underlying history of dementia and     Parkinson disease.  This is further exacerbated by anoxic     encephalopathy.  Currently, she does track, follow commands     intermittently, and respond occasionally verbally to her family. 5. Pneumothorax secondary to CPR.  This did require chest tube     placement during hospital course and has since resolved.  Please     see above for details. 6. Pneumonia.  She was treated for community-acquired pneumonia during     hospitalization secondary to probable aspiration during CPR event.     Please see above antibiotic data.  No organisms specified during     hospitalization for sputum source. 7. Acute renal insufficiency, secondary to shock, hypotension.  She     was aggressively volume resuscitated during initial hospital admit.     She was initially  anuric and this did resolve throughout hospital     course.  She currently does have a Foley catheter which will remain     in place for transfer to home, and this will need to be changed     once every 2-3 months.  Per home health nursing, acute renal     failure has resolved at the time of discharge. 8. Hypernatremia.  This also has resolved.  Sodium was within normal     limits on day of discharge. 9. Anemia secondary to chronic critical illness.  Shawna Torres     hemoglobin and hematocrit are stable without evidence of bleeding.     Hematocrit on April 25, 2011, was 31.6 and hemoglobin was 9.7. 10.Dysphagia.  Postextubation, Shawna Torres did have significant     dysphagia and extensive discussions with family were held regarding     potential PEG placement; however, given her status post CPR and     prolonged hospitalization, it was felt that PEG would not offer Ms.     Torres any benefit at this time.  She is a full DNR/DNI, and the     family wishes for her to be able to eat at this time.  She did     undergo evaluation by Speech Pathology and passed for a dysphagia 1     which is pureed food and nectar thick liquids with meds crushed in     applesauce.  Her medications at time of discharge included Sinemet and Celexa.  I discussed with pharmacy regarding these medications and although it is not optimal to crush these medications given Shawna Torres's current medical state and family wishes to not pursue a PEG, this is the best option.  DISCHARGE INSTRUCTIONS: 1. Activity as tolerated and as instructed per home physical therapy. 2. Diet.  No restrictions.  On content, however, a pureed diet with     nectar-thick liquids recommended with aspiration precautions.  The     family has been extensively educated during hospital course by     Speech Therapy regarding aspiration cautions and recognition of     aspiration. 3. Followup appointment.  She is scheduled to follow up with Delsa Sale, nurse practitioner at [facility], on Tuesday,     May 04, 2011, at 10:30 a.m. for hospital followup.  DISCHARGE ASSISTANCE:  Shawna Torres will be discharged with a gel overlay for her home hospital bed.  She also will be discharged with home health PT, OT, nursing aide, mechanical Hoyer lift.  She will continue on aspiration precautions as discussed with family.  CODE STATUS:  DNR/DNI.  DISCHARGE MEDICATIONS: 1. Tylenol 650 mg by mouth every 6 hours as needed. 2. Thick-It food thickener for nectar-thick consistency. 3. Aspirin 325 by mouth daily. 4. Carbidopa-levodopa 25/100 one tablet in the morning, two tablets in     the afternoon, and 2 tablets at night. 5. Celexa 20 mg by mouth daily. 6. Folic acid 800 mcg by mouth daily. 7. Galantamine extended release 16 mg by mouth daily. 8. Vitamin B12 - 1000 mcg by mouth daily. She has been instructed to stop taking her Cardizem.  DISPOSITION AT TIME OF DISCHARGE:  Shawna Torres has met maximum benefit of inpatient therapy and is currently medically stable and cleared for discharge to home with maximum home assist and family care.  Prior to discharge, it was discussed with family regarding home hospice assistance and if the patient progresses, the family  understand that they can make self-referral.  TIME SPENT ON DISPOSITION:  Greater than 45 minutes.     Canary Brim, NP   ______________________________ Charlcie Cradle Delford Field, MD, FCCP    BO/MEDQ  D:  04/29/2011  T:  04/29/2011  Job:  [id number]  Electronically Signed by Canary Brim  on 04/30/2011 02:23:42 PM Electronically Signed by Shan Levans MD FCCP on 05/05/2011 09:43:52 PM

## 2011-05-04 ENCOUNTER — Telehealth: Payer: Self-pay | Admitting: *Deleted

## 2011-05-04 NOTE — Telephone Encounter (Signed)
Advised Kristi okay to remove pt stitches. She verbalized understanding and needed nothing else further

## 2011-05-04 NOTE — Telephone Encounter (Signed)
Kristi from [facility] nurse states they need an order to remove pt stitches from wear she had chest tubes in the hospital. Call back at [phone]. Please advise Dr. Delton Coombes if okay. Thanks.   Carver Fila, CMA

## 2011-05-04 NOTE — Telephone Encounter (Signed)
Yes, this is OK, please send order or give verbal order. Thanks

## 2011-05-14 NOTE — Op Note (Signed)
NAME:  SELENNE, COGGIN [facility]                            ACCOUNT NO.:  [id number]   MEDICAL RECORD NO.:  [id number]                   PATIENT TYPE:  OIB   LOCATION:  2550                                 FACILITY:  [facility]   PHYSICIAN:  Almedia Balls. Ranell Patrick, M.D.              DATE OF BIRTH:  January 08, 1932   DATE OF PROCEDURE:  04/08/2003  DATE OF DISCHARGE:                                 OPERATIVE REPORT   PREOPERATIVE DIAGNOSIS:  Right humerus fracture.   POSTOPERATIVE DIAGNOSIS:  Right humerus fracture.   OPERATION PERFORMED:  Intramedullary nail, right humerus fracture utilizing  Depuy Ace humeral nail system.   SURGEON:  Almedia Balls. Ranell Patrick, M.D.   ASSISTANT:  Clarene Reamer, P.A.-C.   ANESTHESIA:  General.   ESTIMATED BLOOD LOSS:  Minimal.   FLUIDS REPLACED:  800 ml crystalloid.   Sponge, needle and instrument counts were correct.   COMPLICATIONS:  None.   Preoperative antibiotics were given.   INDICATIONS FOR PROCEDURE:  The patient is a 77 year old female with  Parkinson's disease who sustained a fall onto her right upper extremity,  sustaining a right humerus fracture.  The patient was treated initially  conservatively.  However, due to her Parkinsonian movement disorder, namely  constant motion of the right upper extremity, the patient was having  telescoping of her humeral fracture and extreme pain.  The patient presented  10 days out with minimal evidence of soft callus formation based on her  clinical examination of significant telescoping of her fracture through  involuntary arm contractions.  After discussing with the family options for  management including continued conservative management versus closed  intramedullary nailing to stabilize her fracture and allow her to move  without fear of disrupting the fracture alignment.  The patient and family  agreed to surgery.  Risk of injury to the radial nerve was discussed.  The  risk of nonunion was also discussed.  Informed  consent was obtained .   DESCRIPTION OF PROCEDURE:  After an adequate level of anesthesia was  achieved, the patient was positioned in modified beach chair position, all  neurovascular structures were padded appropriately.  The right shoulder was  prepped and draped in the usual sterile fashion.  C-arm fluoroscopy was  utilized during the surgery for alignment and positioning.  A small  longitudinal incision was created overlying the lateral edge of the  acromion.  This was done using a 10 blade scalpel.  Dissection was carried  sharply down through subcutaneous tissues.  The deltoid was split in line  with its fibers and the raphe between the anterior and middle heads of the  deltoid.  This exposed the rotator cuff which was identified and incised  over the posterior aspect of the midportion of the humerus, roughly the  junction or the supraspinatus and the infraspinatus.  The articular  cartilage margin was identified and then a guidewire was inserted  at the  articular margin into the proximal humerus.  Its position was confirmed on x-  ray, multiple views followed by step cut reaming to an 11 mm diameter.  Next, a ball tip guidewire was passed across the fracture which was  visualized on multiple views distally with the C-arm and then sequential  reaming with the flexible reamers was performed up to a size 9 mm to accept  an 8 mm x 22 mm Ace Depuy humeral nail. Once the ball tip guidewire was  exchanged for a nonball tip guidewire, the arm was aligned and the nail was  introduced gaining excellent purchase and fixation into the fracture.  Next  the jig insertion handle was used for insertion of the proximal interlock.  A bicortical screw was selected, appropriate length was also measured using  a depth gauge and then the screw was used to lock the proximal portion of  the nail.  Attention was then directed towards the distal interlock where an  AP distal interlock screw was placed  freehand, utilizing open approach where  the surgeon's finger could be placed directly down on the anterior humeral  cortex to ensure no soft tissue injuries were at risk.  At this point  thorough irrigation was performed.  Rotator cuff interval was prepared  utilizing 0 Vicryl suture in a figure-of-eight interrupted fashion getting a  nice smooth repair.  Next, the deltoid was repaired using 0 Vicryl followed  by  2-0 Vicryl for subcu and skin with a 4-0 Monocryl.  Distally 2-0 Vicryl  and 4-0 Monocryl were utilized to prepare the distal interlock hole.  Steri-  Strips were applied followed by a sterile dressing.  The patient was placed  in an arm sling and taken to recovery room in stable condition.                                                Almedia Balls. Ranell Patrick, M.D.    SRN/MEDQ  D:  04/08/2003  T:  04/08/2003  Job:  [id number]

## 2011-09-17 LAB — I-STAT 8, (EC8 V) (CONVERTED LAB)
Acid-Base Excess: 1
BUN: 28 — ABNORMAL HIGH
Bicarbonate: 25.9 — ABNORMAL HIGH
HCT: 40
Operator id: 257131
TCO2: 27
pCO2, Ven: 40.3 — ABNORMAL LOW

## 2011-09-17 LAB — POCT CARDIAC MARKERS
CKMB, poc: <1 — ABNORMAL LOW
Myoglobin, poc: 57.9
Operator id: 257131
Troponin i, poc: <0.05

## 2011-09-17 LAB — POCT I-STAT CREATININE: Creatinine, Ser: 1.3 — ABNORMAL HIGH

## 2011-09-27 ENCOUNTER — Ambulatory Visit: Payer: Medicare Other | Attending: Family Medicine | Admitting: Physical Therapy

## 2011-09-27 DIAGNOSIS — R269 Unspecified abnormalities of gait and mobility: Secondary | ICD-10-CM | POA: Insufficient documentation

## 2011-09-27 DIAGNOSIS — M6281 Muscle weakness (generalized): Secondary | ICD-10-CM | POA: Insufficient documentation

## 2011-09-27 DIAGNOSIS — IMO0001 Reserved for inherently not codable concepts without codable children: Secondary | ICD-10-CM | POA: Insufficient documentation

## 2011-10-12 ENCOUNTER — Ambulatory Visit: Payer: Medicare Other | Admitting: Physical Therapy

## 2011-10-14 ENCOUNTER — Ambulatory Visit: Payer: Medicare Other | Admitting: Physical Therapy

## 2011-10-18 ENCOUNTER — Ambulatory Visit: Payer: Medicare Other | Admitting: Physical Therapy

## 2011-10-20 ENCOUNTER — Ambulatory Visit: Payer: Medicare Other | Admitting: Physical Therapy

## 2011-10-25 ENCOUNTER — Ambulatory Visit: Payer: Medicare Other | Admitting: Physical Therapy

## 2011-10-27 ENCOUNTER — Ambulatory Visit: Payer: Medicare Other | Admitting: Physical Therapy

## 2011-11-01 ENCOUNTER — Ambulatory Visit: Payer: Medicare Other | Attending: Family Medicine | Admitting: Physical Therapy

## 2011-11-01 DIAGNOSIS — IMO0001 Reserved for inherently not codable concepts without codable children: Secondary | ICD-10-CM | POA: Insufficient documentation

## 2011-11-01 DIAGNOSIS — R269 Unspecified abnormalities of gait and mobility: Secondary | ICD-10-CM | POA: Insufficient documentation

## 2011-11-01 DIAGNOSIS — M6281 Muscle weakness (generalized): Secondary | ICD-10-CM | POA: Insufficient documentation

## 2011-11-03 ENCOUNTER — Ambulatory Visit: Payer: Medicare Other | Admitting: Physical Therapy

## 2011-11-15 ENCOUNTER — Ambulatory Visit: Payer: Medicare Other | Admitting: Physical Therapy

## 2011-11-16 ENCOUNTER — Ambulatory Visit: Payer: Medicare Other | Admitting: Rehabilitative and Restorative Service Providers"

## 2011-11-22 ENCOUNTER — Ambulatory Visit: Payer: Medicare Other | Admitting: Physical Therapy

## 2011-11-24 ENCOUNTER — Ambulatory Visit: Payer: Medicare Other | Admitting: Physical Therapy

## 2011-11-29 ENCOUNTER — Ambulatory Visit: Payer: Medicare Other | Attending: Family Medicine | Admitting: Physical Therapy

## 2011-11-29 DIAGNOSIS — M6281 Muscle weakness (generalized): Secondary | ICD-10-CM | POA: Insufficient documentation

## 2011-11-29 DIAGNOSIS — R269 Unspecified abnormalities of gait and mobility: Secondary | ICD-10-CM | POA: Insufficient documentation

## 2011-11-29 DIAGNOSIS — IMO0001 Reserved for inherently not codable concepts without codable children: Secondary | ICD-10-CM | POA: Insufficient documentation

## 2011-12-01 ENCOUNTER — Ambulatory Visit: Payer: Medicare Other | Admitting: Physical Therapy

## 2011-12-06 ENCOUNTER — Ambulatory Visit: Payer: Medicare Other | Admitting: Physical Therapy

## 2011-12-08 ENCOUNTER — Ambulatory Visit: Payer: Medicare Other | Admitting: Physical Therapy

## 2012-05-16 IMAGING — CT CT HEAD W/O CM
2 series · 16 of 30 positions shown, 18 images · non-contrast
Comparison: 02/13/2008

CLINICAL DATA: Unresponsive

CT HEAD WITHOUT CONTRAST
TECHNIQUE: Contiguous axial images were obtained from the base of
the skull through the vertex without contrast.

[Series 3: head w/o · axial · non-contrast · 0.49mm/px · z∈[+72,+197]mm · 8 of 33 slices shown, 10 images]
[im 4/33  brain]
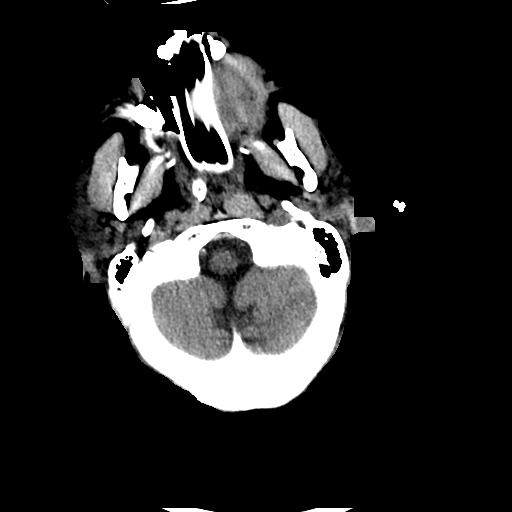
[im 4/33  bone]
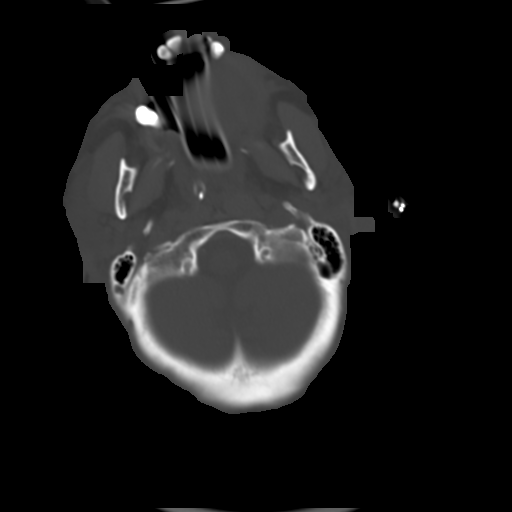
[im 8/33  brain]
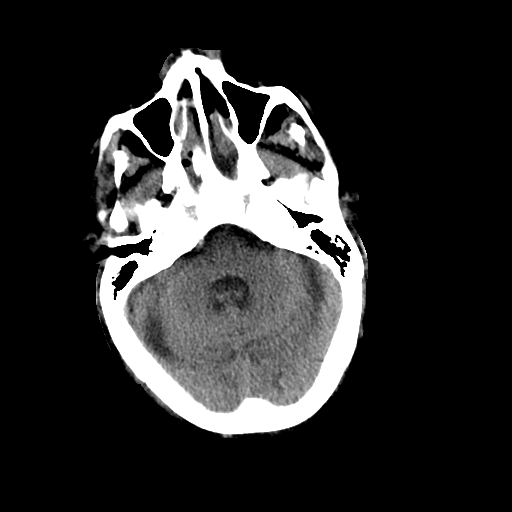
[im 11/33  brain]
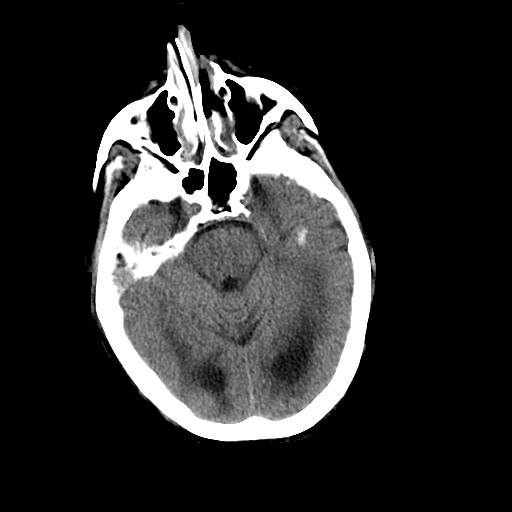
[im 15/33  brain]
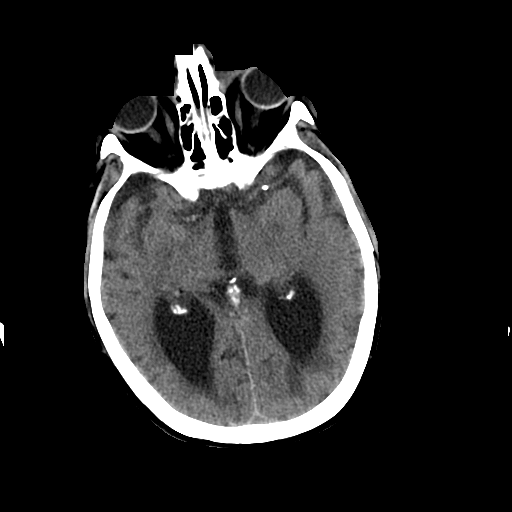
[im 18/33  brain]
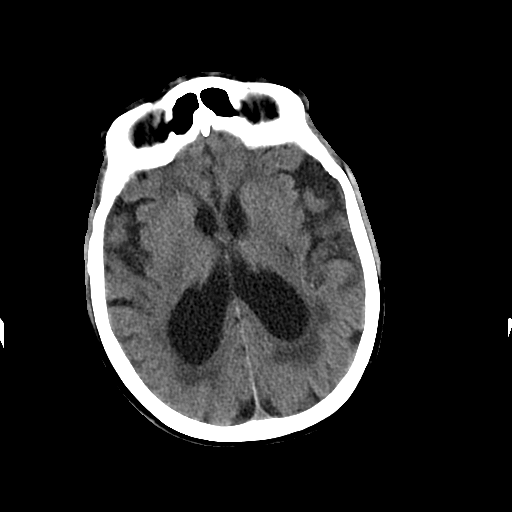
[im 18/33  bone]
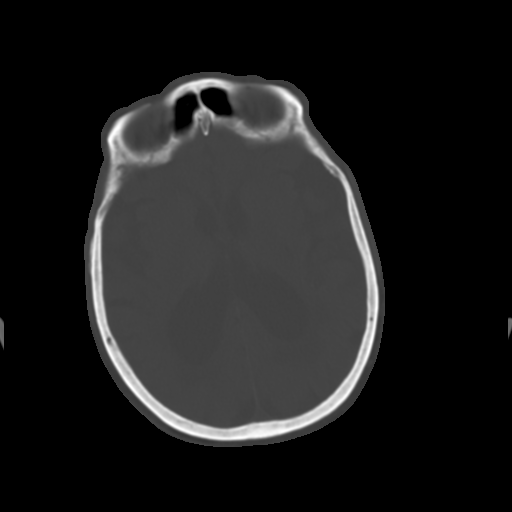
[im 22/33  brain]
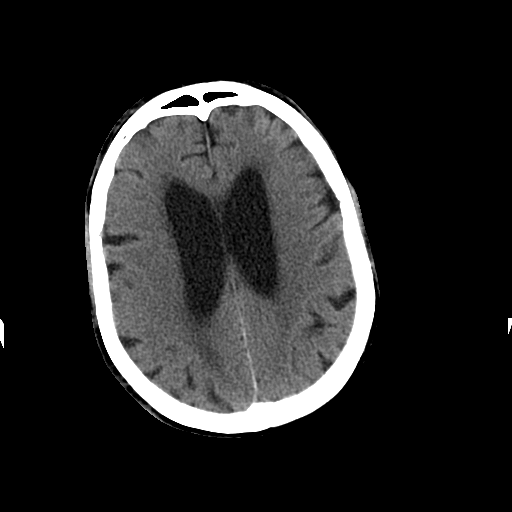
[im 25/33  brain]
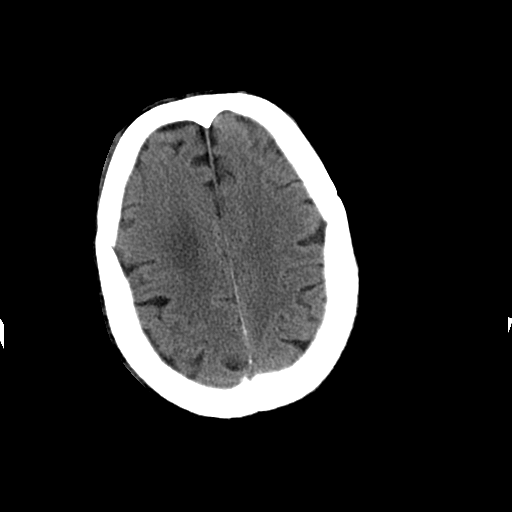
[im 29/33  brain]
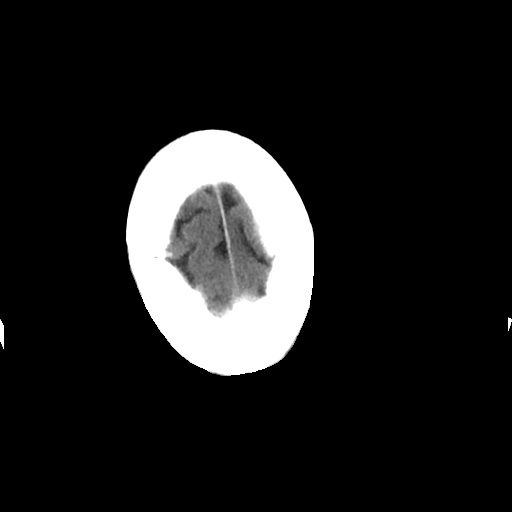

[Series 4: head w/o bone · axial · non-contrast · 0.49mm/px · z∈[+72,+199]mm · 8 of 65 slices shown]
[im 7/65  bone]
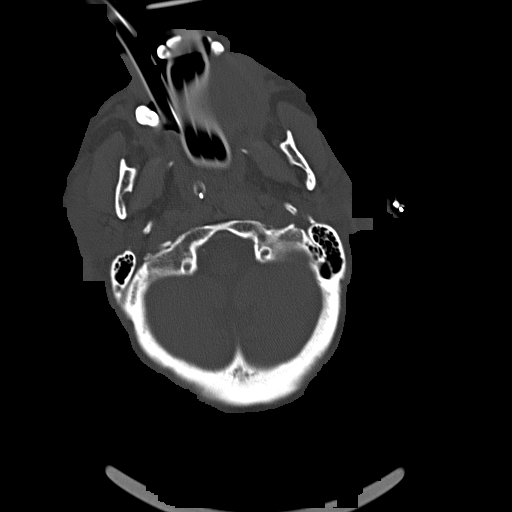
[im 14/65  bone]
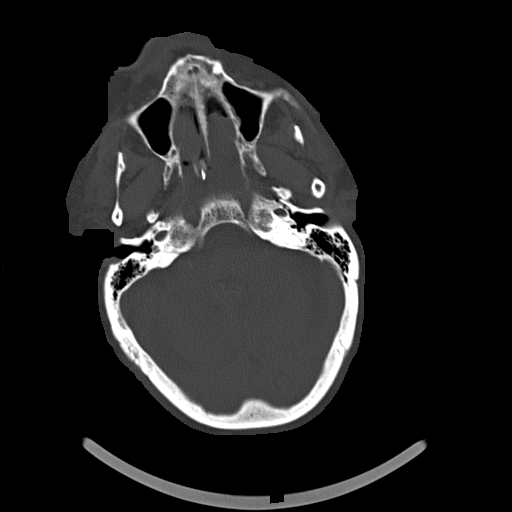
[im 21/65  bone]
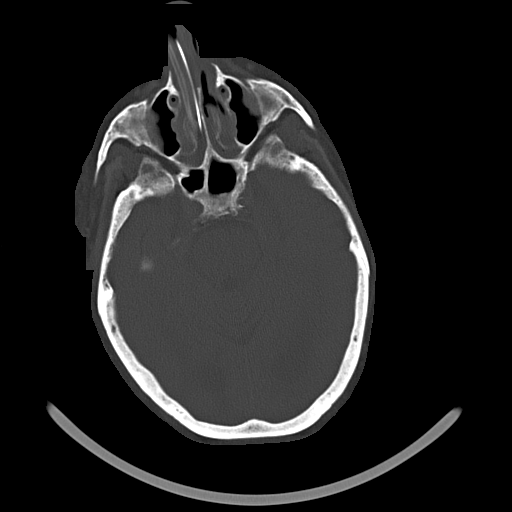
[im 27/65  bone]
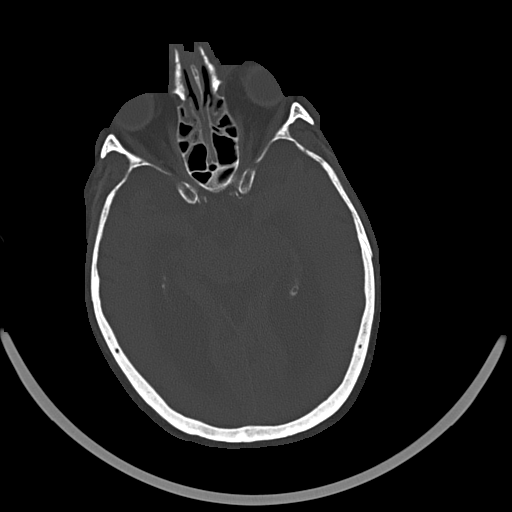
[im 38/65  bone]
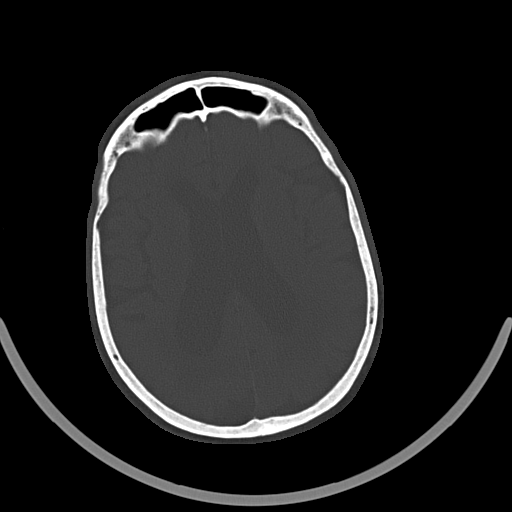
[im 44/65  bone]
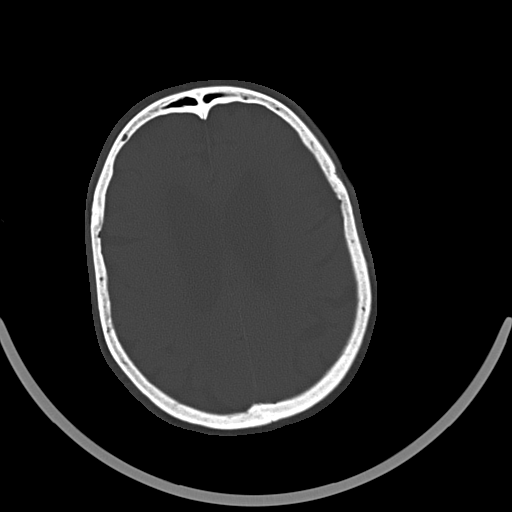
[im 51/65  bone]
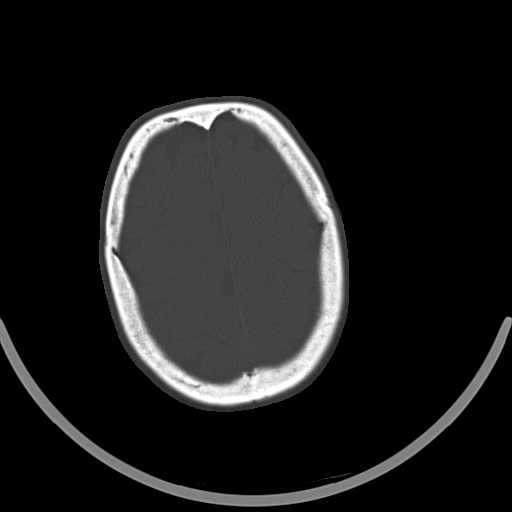
[im 58/65  bone]
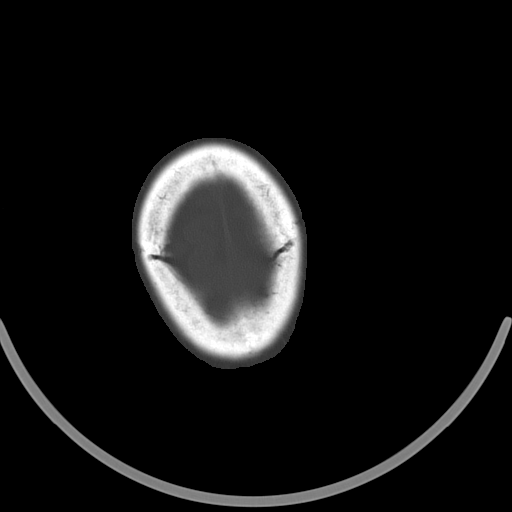

[16 of 30 positions shown; findings below may reference images not displayed]

FINDINGS: Endotracheal tube and oral airway partly visualized.
Cortical volume loss noted with proportional ventricular
prominence.  Periventricular white matter hypodensity likely
indicates small vessel ischemic change.  No acute hemorrhage, acute
infarction, or mass lesion is identified.  No midline shift.
Evidence of prior scleral banding noted.  Nasogastric tube partly
visualized.  Partial opacification of the ethmoid sinuses is noted.
Bilateral minimally displaced nasal bone fractures are present.
Adjacent soft tissue swelling noted. Soft tissue density in the
bilateral external auditory canals likely represents cerumen.
Minimal left maxillary sinus mucoperiosteal thickening noted.
IMPRESSION: No acute intracranial finding.  Chronic findings as above.

Bilateral minimally displaced nasal bone fractures.

Sinusitis.

## 2012-05-17 IMAGING — CR DG CHEST 1V PORT
1 series · 1 of 1 positions shown · non-contrast
Comparison: Same day

CLINICAL DATA: Chest tube placement on the right.

PORTABLE CHEST - 1 VIEW

[AP]
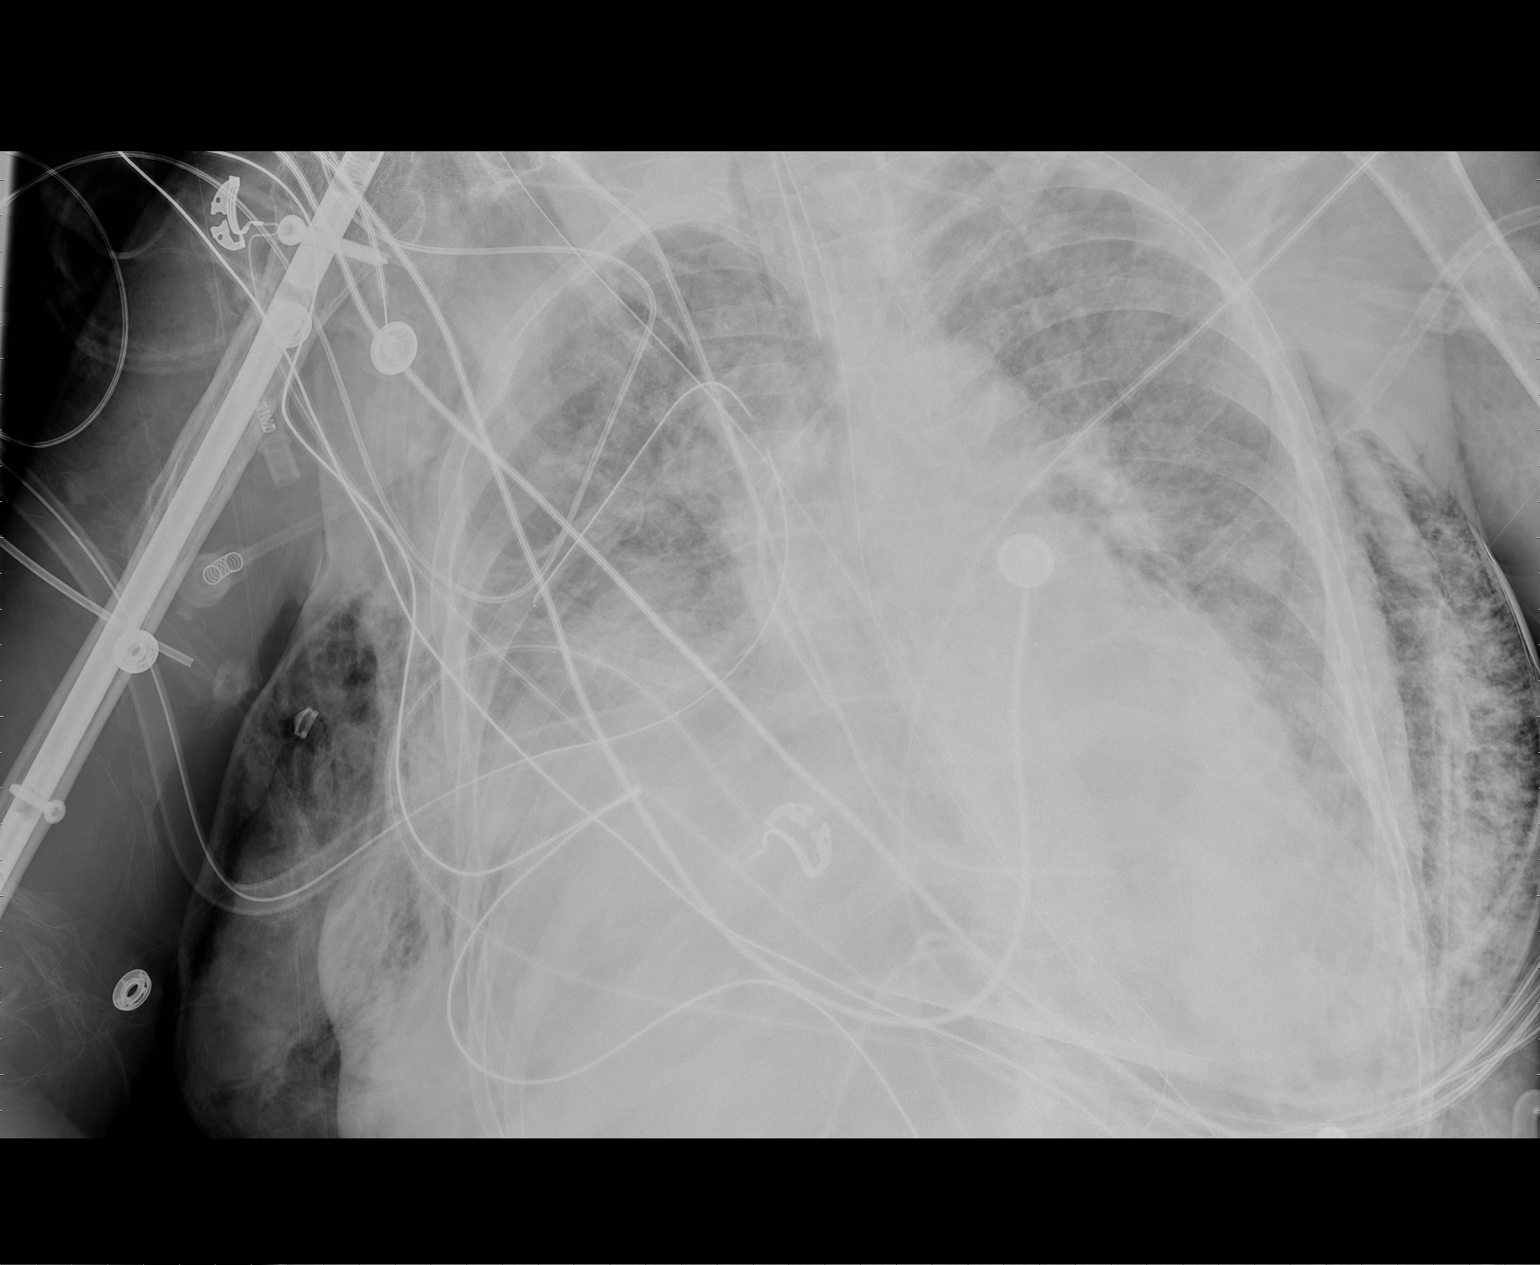

[1 of 1 positions shown; findings below may reference images not displayed]

FINDINGS: Endotracheal tube has its tip 2 cm above right atrium.
Nasogastric tube enters the stomach.  Left internal jugular central
line has its tip at the SVC/RA junction or in the proximal right
atrium. Transvenous pacemaker from the right jugular approach has
its tip in the region of the right ventricle.  Right chest tube has
been advanced.  No pneumothorax visible presently.  Subcutaneous
air remains evident.  Pulmonary edema and lower lobe atelectasis
and/or infiltrate persists.
IMPRESSION: Lines and tubes well positioned.  Advancement of the right-sided
chest tube.  No pneumothorax seen.

## 2012-05-17 IMAGING — CR DG CHEST 1V PORT
1 series · 1 of 1 positions shown · non-contrast
Comparison: 04/09/2011.

CLINICAL DATA: Chest pain.

PORTABLE CHEST - 1 VIEW

[AP]
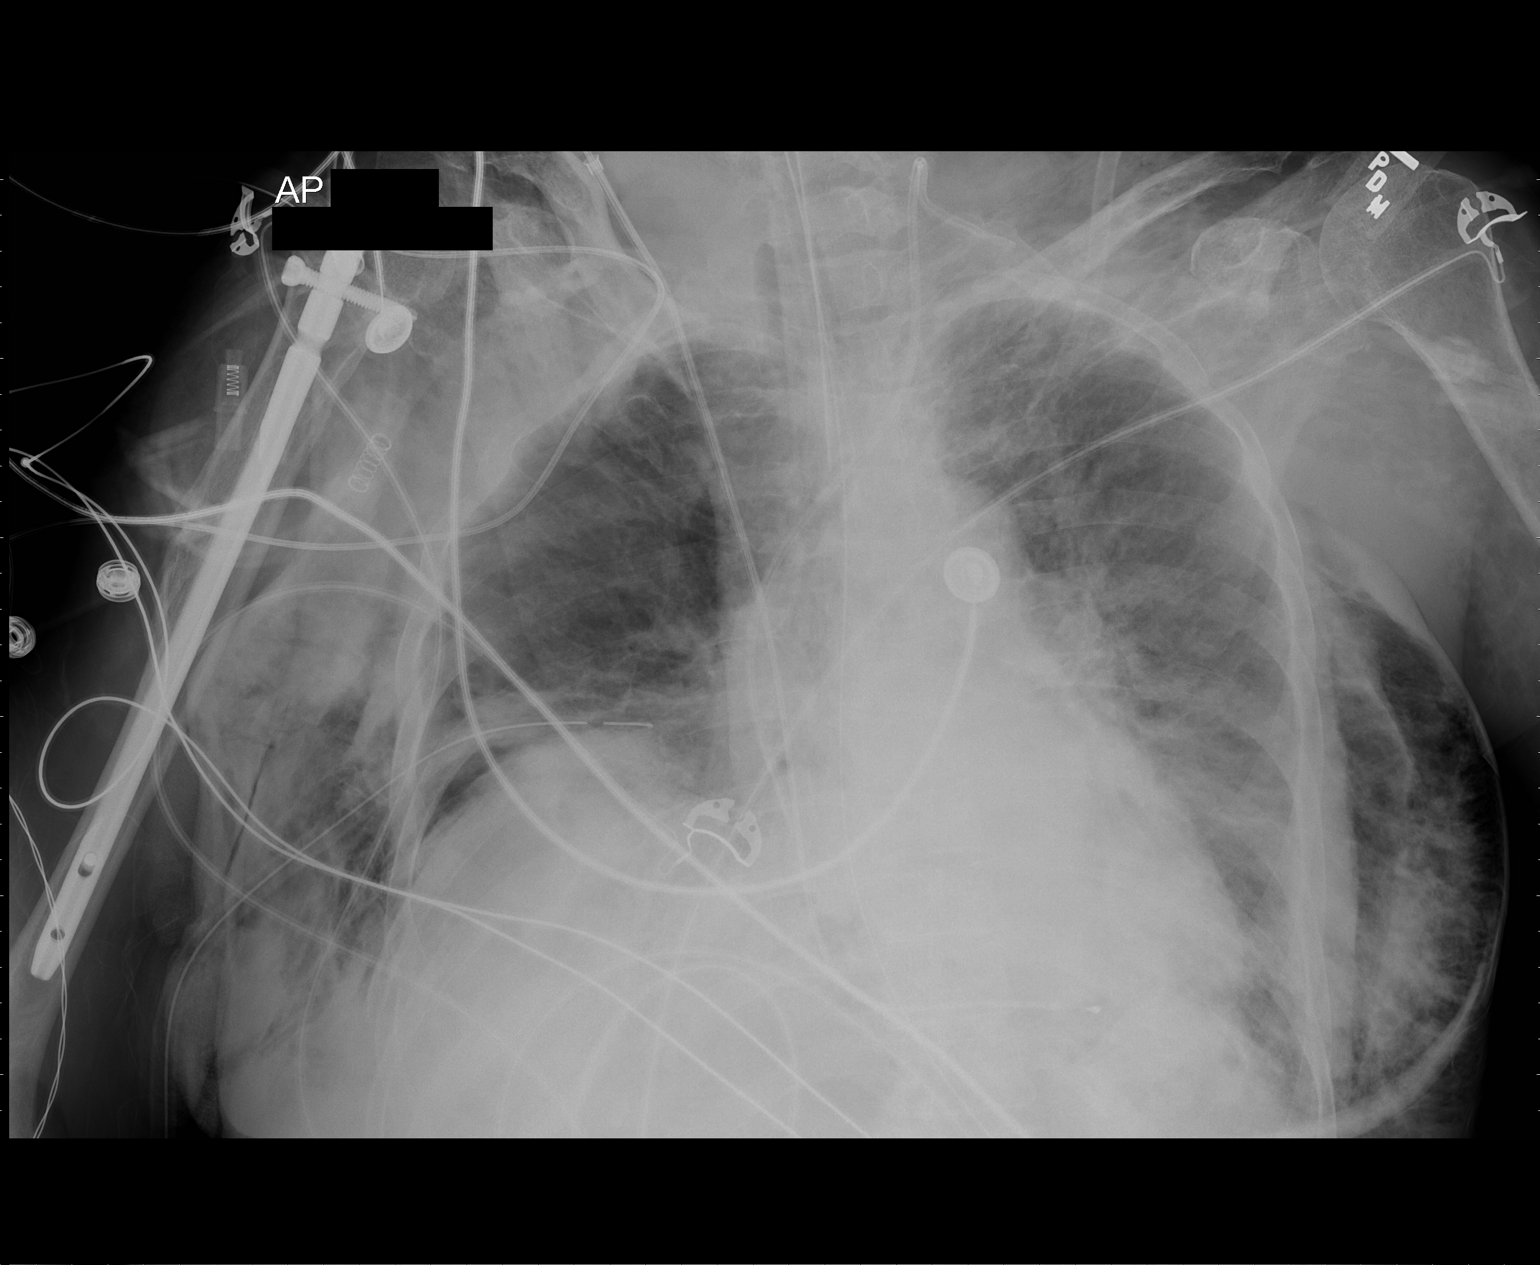

[1 of 1 positions shown; findings below may reference images not displayed]

FINDINGS: The patient remains intubated.  The endotracheal tube is
in satisfactory position.  An NG tube courses off the inferior
border of the film.  A left IJ line is stable.  Right-sided chest
tube remains in place. There is no definite pneumothorax.  Diffuse
pulmonary vascular congestion has increased. The right
hemidiaphragm remains elevated.  The heart size is normal.

Extensive subcutaneous emphysema is now present bilaterally.
Multiple right-sided rib fractures are again noted.
IMPRESSION: 1.  Interval development of diffuse bilateral subcutaneous
emphysema.
2.  Stable support apparatus.
3.  Interval increase and pulmonary vascular congestion.

Critical test results telephoned to the patient's [REDACTED], on

## 2012-06-30 ENCOUNTER — Observation Stay (HOSPITAL_COMMUNITY)
Admission: EM | Admit: 2012-06-30 | Discharge: 2012-06-30 | Disposition: A | Discharge status: Home / Self Care | Payer: Medicare Other | Attending: Emergency Medicine | Admitting: Emergency Medicine

## 2012-06-30 ENCOUNTER — Encounter (HOSPITAL_COMMUNITY): Payer: Self-pay

## 2012-06-30 ENCOUNTER — Observation Stay (HOSPITAL_COMMUNITY): Payer: Medicare Other

## 2012-06-30 ENCOUNTER — Emergency Department (HOSPITAL_COMMUNITY): Payer: Medicare Other

## 2012-06-30 DIAGNOSIS — I059 Rheumatic mitral valve disease, unspecified: Secondary | ICD-10-CM

## 2012-06-30 DIAGNOSIS — I672 Cerebral atherosclerosis: Secondary | ICD-10-CM | POA: Insufficient documentation

## 2012-06-30 DIAGNOSIS — G459 Transient cerebral ischemic attack, unspecified: Principal | ICD-10-CM | POA: Insufficient documentation

## 2012-06-30 DIAGNOSIS — R64 Cachexia: Secondary | ICD-10-CM | POA: Insufficient documentation

## 2012-06-30 DIAGNOSIS — G20 Parkinson's disease: Secondary | ICD-10-CM | POA: Insufficient documentation

## 2012-06-30 DIAGNOSIS — G20A1 Parkinson's disease without dyskinesia, without mention of fluctuations: Secondary | ICD-10-CM | POA: Insufficient documentation

## 2012-06-30 HISTORY — DX: Parkinson's disease without dyskinesia, without mention of fluctuations: G20.A1

## 2012-06-30 HISTORY — DX: Parkinson's disease: G20

## 2012-06-30 LAB — CBC
HCT: 36.4 % (ref 36.0–46.0)
Hemoglobin: 11.8 g/dL — ABNORMAL LOW (ref 12.0–15.0)
MCV: 91.5 fL (ref 78.0–100.0)
Platelets: 140 10*3/uL — ABNORMAL LOW (ref 150–400)
RBC: 3.98 MIL/uL (ref 3.87–5.11)
WBC: 7.1 10*3/uL (ref 4.0–10.5)

## 2012-06-30 LAB — COMPREHENSIVE METABOLIC PANEL
AST: 29 U/L (ref 0–37)
Alkaline Phosphatase: 94 U/L (ref 39–117)
BUN: 32 mg/dL — ABNORMAL HIGH (ref 6–23)
CO2: 30 mEq/L (ref 19–32)
CO2: 22 mEq/L (ref 19–32)
Chloride: 109 mEq/L (ref 96–112)
Chloride: 106 mEq/L (ref 96–112)
Creatinine, Ser: 0.48 mg/dL — ABNORMAL LOW (ref 0.50–1.10)
GFR calc Af Amer: >90 mL/min (ref >90)
GFR calc non Af Amer: 87 mL/min — ABNORMAL LOW (ref >90)
GFR calc non Af Amer: >90 mL/min (ref >90)
Glucose, Bld: 87 mg/dL (ref 70–99)
Potassium: 4.1 mEq/L (ref 3.5–5.1)
Total Bilirubin: 0.4 mg/dL (ref 0.3–1.2)
Total Bilirubin: 0.4 mg/dL (ref 0.3–1.2)

## 2012-06-30 LAB — POCT I-STAT TROPONIN I: Troponin i, poc: 0.02 ng/mL (ref 0.00–0.08)

## 2012-06-30 LAB — LIPID PANEL
HDL: 79 mg/dL (ref >39)
LDL Cholesterol: 104 mg/dL — ABNORMAL HIGH (ref 0–99)
Total CHOL/HDL Ratio: 2.5 RATIO

## 2012-06-30 LAB — URINALYSIS, ROUTINE W REFLEX MICROSCOPIC
Bilirubin Urine: NEGATIVE
Ketones, ur: NEGATIVE mg/dL
Leukocytes, UA: NEGATIVE
Nitrite: NEGATIVE
Protein, ur: NEGATIVE mg/dL

## 2012-06-30 LAB — CBC WITH DIFFERENTIAL/PLATELET
Eosinophils Absolute: 0.2 10*3/uL (ref 0.0–0.7)
Hemoglobin: 12 g/dL (ref 12.0–15.0)
Lymphocytes Relative: 18 % (ref 12–46)
Lymphs Abs: 1.5 10*3/uL (ref 0.7–4.0)
MCH: 29.1 pg (ref 26.0–34.0)
Monocytes Relative: 8 % (ref 3–12)
Neutro Abs: 5.8 10*3/uL (ref 1.7–7.7)
Neutrophils Relative %: 72 % (ref 43–77)
RBC: 4.12 MIL/uL (ref 3.87–5.11)

## 2012-06-30 LAB — PROTIME-INR: INR: 1.01 (ref 0.00–1.49)

## 2012-06-30 MED ORDER — SODIUM CHLORIDE 0.9 % IV SOLN
Freq: Once | INTRAVENOUS | Status: AC
  Administered 2012-06-30: 14:00:00 via INTRAVENOUS

## 2012-06-30 NOTE — ED Notes (Signed)
Report received, assumed care. Presently in ECHO, will return to CDU6  After test

## 2012-06-30 NOTE — ED Provider Notes (Signed)
History     CSN: [id number]  Arrival date & time 06/30/12  1224   First MD Initiated Contact with Patient 06/30/12 1249      Chief Complaint  Patient presents with  . Altered Mental Status    (Consider location/radiation/quality/duration/timing/severity/associated sxs/prior treatment) The history is provided by the patient and a relative.   Patient here after having 10-minute episode of decreased level of consciousness which has since resolved. Patient has a baseline history of Parkinson's disease. Caregiver notes that she was giving her milk and milk began to drool out of the right side of her mouth and the patient was not verbal. No seizure activity noted. No aspiration noted. No prior history of same. Patient is back to her baseline Past Medical History  Diagnosis Date  . Parkinson disease     No past surgical history on file.  No family history on file.  History  Substance Use Topics  . Smoking status: Not on file  . Smokeless tobacco: Not on file  . Alcohol Use:     OB History    Grav Para Term Preterm Abortions TAB SAB Ect Mult Living                  Review of Systems  All other systems reviewed and are negative.    Allergies  Review of patient's allergies indicates no known allergies.  Home Medications   Current Outpatient Rx  Name Route Sig Dispense Refill  . ASPIRIN 81 MG PO CHEW Oral Chew 81 mg by mouth daily.    Marland Kitchen CALCIUM CARBONATE-VITAMIN D 600-400 MG-UNIT PO TABS Oral Take 1 tablet by mouth daily.    Marland Kitchen CARBIDOPA-LEVODOPA 25-100 MG PO TBDP Oral Take 1-2 tablets by mouth 2 (two) times daily. Takes 1 tablet in the am and 2 tablets in the pm.    . FOLIC ACID 400 MCG PO TABS Oral Take 400 mcg by mouth daily.    . ADULT MULTIVITAMIN W/MINERALS CH Oral Take 2 tablets by mouth daily.      BP 182/88  Pulse 60  Temp 97.6 F (36.4 C) (Oral)  Resp 15  SpO2 100%  Physical Exam  Nursing note and vitals reviewed. Constitutional: She appears cachectic.   Non-toxic appearance. She has a sickly appearance. She appears ill. No distress.  HENT:  Head: Normocephalic and atraumatic.  Eyes: Conjunctivae, EOM and lids are normal. Pupils are equal, round, and reactive to light.  Neck: Normal range of motion. Neck supple. No tracheal deviation present. No mass present.  Cardiovascular: Normal rate, regular rhythm and normal heart sounds.  Exam reveals no gallop.   No murmur heard. Pulmonary/Chest: Effort normal and breath sounds normal. No stridor. No respiratory distress. She has no decreased breath sounds. She has no wheezes. She has no rhonchi. She has no rales.  Abdominal: Soft. Normal appearance and bowel sounds are normal. She exhibits no distension. There is no tenderness. There is no rebound and no CVA tenderness.  Musculoskeletal: Normal range of motion. She exhibits no edema and no tenderness.  Neurological: She is alert. No cranial nerve deficit or sensory deficit. She exhibits abnormal muscle tone. GCS eye subscore is 4. GCS verbal subscore is 5. GCS motor subscore is 6.  Skin: Skin is warm and dry. No abrasion and no rash noted.  Psychiatric: She has a normal mood and affect. Her speech is normal and behavior is normal.    ED Course  Procedures (including critical care time)   Labs  Reviewed  CBC WITH DIFFERENTIAL  COMPREHENSIVE METABOLIC PANEL  URINALYSIS, ROUTINE W REFLEX MICROSCOPIC  URINE CULTURE   No results found.   No diagnosis found.    MDM  Suspect that patient has a Crist Infante, will place on tia protocol        Toy Baker, MD 06/30/12 1454

## 2012-06-30 NOTE — ED Notes (Signed)
Per EMS, pt family states altered LOC. Reports she answers questions slower than normal and per daughter right sided facial droop that is more than normal.

## 2012-06-30 NOTE — ED Notes (Signed)
Pt. Here from home with reports LOC from daughter. Pt. Alert and oriented X4. Denies pain.

## 2012-06-30 NOTE — ED Notes (Signed)
MD at bedside. 

## 2012-06-30 NOTE — ED Notes (Signed)
Daughter also reports hx cardiac arrest. Pt. Full code.

## 2012-06-30 NOTE — Progress Notes (Signed)
VASCULAR LAB PRELIMINARY  PRELIMINARY  PRELIMINARY  PRELIMINARY  Carotid duplex completed.    Preliminary report:  Bilateral:  No evidence of hemodynamically significant internal carotid artery stenosis.   Vertebral artery flow is antegrade.     Mahitha Hickling, RVS 06/30/2012, 4:21 PM

## 2012-06-30 NOTE — ED Notes (Signed)
Per daughter, around 1115 this morning daughter noticed patient "wasn't acting herself".  States that pt was drinking milk and started drooling out of right side of mouth, reports pt. Wasn't responding/answering questions daughter was asking. Pt. Oriented X4. Lethargic as reported by daughter.

## 2012-06-30 NOTE — ED Notes (Signed)
Family reports pt back at baseline Denies pain

## 2012-06-30 NOTE — ED Notes (Signed)
Pt. In echo.

## 2012-06-30 NOTE — ED Notes (Signed)
14 Fr Foley inserted with no resistance.  Clear yellow urine returned

## 2012-06-30 NOTE — Progress Notes (Signed)
  Echocardiogram 2D Echocardiogram has been performed.  Shawna Torres FRANCES 06/30/2012, 3:57 PM

## 2012-06-30 NOTE — ED Notes (Signed)
Pt. In CT. 

## 2012-06-30 NOTE — ED Notes (Signed)
Pt taken to MRI from vascular lab after ECHO & carotid dopplers completed

## 2012-06-30 NOTE — ED Provider Notes (Signed)
The patient's daughter and son are at bedside. They report the patient has been at baseline without recurrent symptoms. MRI/MRA are negative for acute event. Will discharge home with PCP follow up. Son and daughter say they will transport the patient.  Rodena Medin, PA-C 06/30/12 1909

## 2012-07-01 LAB — HEMOGLOBIN A1C: Hgb A1c MFr Bld: 5.1 % (ref <5.7)

## 2012-07-01 LAB — URINE CULTURE: Colony Count: NO GROWTH

## 2012-07-01 NOTE — ED Provider Notes (Addendum)
Medical screening examination/treatment/procedure(s) were conducted as a shared visit with non-physician practitioner(s) and myself.  I personally evaluated the patient during the encounter  Toy Baker, MD 07/01/12 4098   Date: 09/10/2012  Rate:89  Rhythm: sinus    QRS Axis: left  Intervals: nl  ST/T Wave abnormalities:nonspecific  Conduction Disutrbances:LBBB  Narrative Interpretation:   Old EKG Reviewed: none    Toy Baker, MD 09/10/12 [phone]

## 2014-03-01 ENCOUNTER — Other Ambulatory Visit (HOSPITAL_COMMUNITY): Payer: Self-pay | Admitting: Family Medicine

## 2014-03-01 DIAGNOSIS — R1310 Dysphagia, unspecified: Secondary | ICD-10-CM

## 2014-03-06 ENCOUNTER — Encounter (HOSPITAL_COMMUNITY): Payer: Medicare Other

## 2014-03-06 ENCOUNTER — Ambulatory Visit (HOSPITAL_COMMUNITY): Payer: Medicare Other

## 2014-03-06 ENCOUNTER — Other Ambulatory Visit (HOSPITAL_COMMUNITY): Payer: Medicare Other

## 2014-03-06 ENCOUNTER — Other Ambulatory Visit (HOSPITAL_COMMUNITY): Payer: Self-pay

## 2014-03-12 ENCOUNTER — Inpatient Hospital Stay (HOSPITAL_COMMUNITY): Admission: RE | Admit: 2014-03-12 | Payer: Self-pay | Source: Ambulatory Visit

## 2014-03-12 ENCOUNTER — Ambulatory Visit (HOSPITAL_COMMUNITY): Admission: RE | Admit: 2014-03-12 | Payer: Medicare Other | Source: Ambulatory Visit

## 2014-07-26 ENCOUNTER — Encounter (HOSPITAL_COMMUNITY): Payer: Self-pay | Admitting: Emergency Medicine

## 2014-07-26 ENCOUNTER — Emergency Department (HOSPITAL_COMMUNITY): Payer: Medicare Other

## 2014-07-26 ENCOUNTER — Observation Stay (HOSPITAL_COMMUNITY): Payer: Medicare Other

## 2014-07-26 ENCOUNTER — Observation Stay (HOSPITAL_COMMUNITY)
Admission: EM | Admit: 2014-07-26 | Discharge: 2014-07-27 | Disposition: A | Discharge status: Home / Self Care | Payer: Medicare Other | Attending: Internal Medicine | Admitting: Internal Medicine

## 2014-07-26 DIAGNOSIS — Z79899 Other long term (current) drug therapy: Secondary | ICD-10-CM | POA: Insufficient documentation

## 2014-07-26 DIAGNOSIS — G20 Parkinson's disease: Secondary | ICD-10-CM

## 2014-07-26 DIAGNOSIS — K5900 Constipation, unspecified: Secondary | ICD-10-CM | POA: Diagnosis present

## 2014-07-26 DIAGNOSIS — I10 Essential (primary) hypertension: Secondary | ICD-10-CM | POA: Diagnosis present

## 2014-07-26 DIAGNOSIS — R404 Transient alteration of awareness: Secondary | ICD-10-CM | POA: Insufficient documentation

## 2014-07-26 DIAGNOSIS — R55 Syncope and collapse: Principal | ICD-10-CM

## 2014-07-26 DIAGNOSIS — G20A1 Parkinson's disease without dyskinesia, without mention of fluctuations: Secondary | ICD-10-CM | POA: Diagnosis present

## 2014-07-26 DIAGNOSIS — R627 Adult failure to thrive: Secondary | ICD-10-CM

## 2014-07-26 LAB — BASIC METABOLIC PANEL
ANION GAP: 11 (ref 5–15)
BUN: 22 mg/dL (ref 6–23)
CALCIUM: 8.7 mg/dL (ref 8.4–10.5)
CO2: 26 meq/L (ref 19–32)
CREATININE: 0.59 mg/dL (ref 0.50–1.10)
Chloride: 102 mEq/L (ref 96–112)
GFR calc non Af Amer: 84 mL/min — ABNORMAL LOW (ref >90)
Glucose, Bld: 76 mg/dL (ref 70–99)
Potassium: 4.5 mEq/L (ref 3.7–5.3)
SODIUM: 139 meq/L (ref 137–147)

## 2014-07-26 LAB — CBC WITH DIFFERENTIAL/PLATELET
BASOS ABS: 0 10*3/uL (ref 0.0–0.1)
BASOS PCT: 0 % (ref 0–1)
EOS PCT: 2 % (ref 0–5)
Eosinophils Absolute: 0.2 10*3/uL (ref 0.0–0.7)
HEMATOCRIT: 35.6 % — AB (ref 36.0–46.0)
Hemoglobin: 11.2 g/dL — ABNORMAL LOW (ref 12.0–15.0)
LYMPHS PCT: 12 % (ref 12–46)
Lymphs Abs: 0.9 10*3/uL (ref 0.7–4.0)
MCH: 26.5 pg (ref 26.0–34.0)
MCHC: 31.5 g/dL (ref 30.0–36.0)
MCV: 84.2 fL (ref 78.0–100.0)
MONO ABS: 0.6 10*3/uL (ref 0.1–1.0)
Monocytes Relative: 9 % (ref 3–12)
NEUTROS ABS: 5.4 10*3/uL (ref 1.7–7.7)
Neutrophils Relative %: 77 % (ref 43–77)
PLATELETS: 176 10*3/uL (ref 150–400)
RBC: 4.23 MIL/uL (ref 3.87–5.11)
RDW: 15.9 % — AB (ref 11.5–15.5)
WBC: 7 10*3/uL (ref 4.0–10.5)

## 2014-07-26 LAB — URINALYSIS, ROUTINE W REFLEX MICROSCOPIC
BILIRUBIN URINE: NEGATIVE
Glucose, UA: NEGATIVE mg/dL
Hgb urine dipstick: NEGATIVE
KETONES UR: NEGATIVE mg/dL
LEUKOCYTES UA: NEGATIVE
NITRITE: NEGATIVE
PROTEIN: NEGATIVE mg/dL
Specific Gravity, Urine: 1.012 (ref 1.005–1.030)
UROBILINOGEN UA: 1 mg/dL (ref 0.0–1.0)
pH: 6.5 (ref 5.0–8.0)

## 2014-07-26 LAB — TROPONIN I: Troponin I: <0.3 ng/mL (ref <0.30)

## 2014-07-26 MED ORDER — POLYETHYLENE GLYCOL 3350 17 G PO PACK
17.0000 g | PACK | Freq: Two times a day (BID) | ORAL | Status: DC
  Administered 2014-07-26 – 2014-07-27 (×2): 17 g via ORAL
  Filled 2014-07-26 (×3): qty 1

## 2014-07-26 MED ORDER — HYDRALAZINE HCL 20 MG/ML IJ SOLN: 2.0000 mg | Freq: Four times a day (QID) | INTRAMUSCULAR | Status: DC | PRN

## 2014-07-26 MED ORDER — VITAMIN B-12 1000 MCG PO TABS
1000.0000 ug | ORAL_TABLET | Freq: Every day | ORAL | Status: DC
  Administered 2014-07-26 – 2014-07-27 (×2): 1000 ug via ORAL
  Filled 2014-07-26 (×2): qty 1

## 2014-07-26 MED ORDER — CARBIDOPA-LEVODOPA 25-100 MG PO TABS
1.0000 | ORAL_TABLET | Freq: Every morning | ORAL | Status: DC
  Administered 2014-07-27: 1 via ORAL
  Filled 2014-07-26: qty 1

## 2014-07-26 MED ORDER — SODIUM CHLORIDE 0.9 % IJ SOLN
3.0000 mL | Freq: Two times a day (BID) | INTRAMUSCULAR | Status: DC
  Administered 2014-07-26 – 2014-07-27 (×2): 3 mL via INTRAVENOUS

## 2014-07-26 MED ORDER — VITAMIN E 180 MG (400 UNIT) PO CAPS
400.0000 [IU] | ORAL_CAPSULE | Freq: Every day | ORAL | Status: DC
  Administered 2014-07-26 – 2014-07-27 (×2): 400 [IU] via ORAL
  Filled 2014-07-26 (×2): qty 1

## 2014-07-26 MED ORDER — ADULT MULTIVITAMIN W/MINERALS CH
2.0000 | ORAL_TABLET | Freq: Every day | ORAL | Status: DC
  Administered 2014-07-26 – 2014-07-27 (×2): 2 via ORAL
  Filled 2014-07-26 (×2): qty 2

## 2014-07-26 MED ORDER — FOLIC ACID 0.5 MG HALF TAB
500.0000 ug | ORAL_TABLET | Freq: Every day | ORAL | Status: DC
  Administered 2014-07-26 – 2014-07-27 (×2): 0.5 mg via ORAL
  Filled 2014-07-26 (×2): qty 1

## 2014-07-26 MED ORDER — CARBIDOPA-LEVODOPA 25-100 MG PO TABS
2.0000 | ORAL_TABLET | Freq: Every day | ORAL | Status: DC
  Administered 2014-07-26: 2 via ORAL
  Filled 2014-07-26 (×2): qty 2

## 2014-07-26 MED ORDER — PANTOPRAZOLE SODIUM 40 MG PO TBEC
40.0000 mg | DELAYED_RELEASE_TABLET | Freq: Every day | ORAL | Status: DC
  Administered 2014-07-27: 40 mg via ORAL
  Filled 2014-07-26: qty 1

## 2014-07-26 MED ORDER — GLYCERIN (LAXATIVE) 2.1 G RE SUPP
1.0000 | RECTAL | Status: DC | PRN
  Filled 2014-07-26: qty 1

## 2014-07-26 MED ORDER — SENNOSIDES-DOCUSATE SODIUM 8.6-50 MG PO TABS
1.0000 | ORAL_TABLET | Freq: Every day | ORAL | Status: DC
  Administered 2014-07-26: 1 via ORAL
  Filled 2014-07-26 (×2): qty 1

## 2014-07-26 MED ORDER — CARBIDOPA-LEVODOPA 25-100 MG PO TBDP: 1.0000 | ORAL_TABLET | Freq: Two times a day (BID) | ORAL | Status: DC

## 2014-07-26 NOTE — H&P (Addendum)
History and Physical    Shawna Torres RUE:[id number]:[id number] DOB: 06/02/1932 DOA: 07/26/2014  Referring physician: Dr. Romeo AppleHarrison PCP: Pamelia HoitWILSON,FRED HENRY, MD  Specialists: none  Chief Complaint: syncope  HPI: Shawna Torres is a 80 y.o. female has a past medical history significant for Parkinson's disease, mild cognitive impairment per family she is AxOx4 most days, is being brought to the ER after a sudden episode of syncope. Patient is non ambulatory and bed bound and her caretaker tells me that she had a sudden episode of unresponsiveness this morning that lasted for ~1 minute. Caregiver states that it appeared that the patient stopped breathing, did not check a pulse. She provided patient 1-2 thumps then patient recovered without apparent deficits or confusion. There are no reported symptoms and patient did not complain of anything prior to that. No reports of chest pain, fevers. As she had her syncope, there were no seizure-like motor activity or bowel or bladder incontinence. In the ER patient close to baseline, mild anemia on CBC but labs essentially unrevealing. Urinalysis clear and CXR without acute findings.   Review of Systems: unable to obtain comprehensive ROS due to mild confusion  Past Medical History  Diagnosis Date  . Parkinson disease    History reviewed. No pertinent past surgical history. Social History:  reports that she has never smoked. She does not have any smokeless tobacco history on file. She reports that she does not drink alcohol. Her drug history is not on file.  No Known Allergies  Family history non contributory.   Prior to Admission medications   Medication Sig Start Date End Date Taking? Authorizing Provider  carbidopa-levodopa (PARCOPA) 25-100 MG per disintegrating tablet Take 1-2 tablets by mouth 2 (two) times daily. Takes 1 tablet in the am and 2 tablets in the pm.   Yes Historical Provider, MD  folic acid (FOLVITE) 400 MCG tablet Take 400 mcg by mouth daily.   Yes  Historical Provider, MD  Multiple Vitamin (MULTIVITAMIN WITH MINERALS) TABS Take 2 tablets by mouth daily.   Yes Historical Provider, MD  omeprazole (PRILOSEC) 20 MG capsule Take 20 mg by mouth daily.   Yes Historical Provider, MD  vitamin B-12 (CYANOCOBALAMIN) 1000 MCG tablet Take 1,000 mcg by mouth daily.   Yes Historical Provider, MD  vitamin E 400 UNIT capsule Take 400 Units by mouth daily.   Yes Historical Provider, MD   Physical Exam: Filed Vitals:   07/26/14 1430 07/26/14 1445 07/26/14 1515 07/26/14 1545  BP: 168/105 187/80 179/83 172/81  Pulse: 78 90 69 72  Temp:      TempSrc:      Resp: 18 15 13 16   SpO2: 99% 100% 98% 99%    General:  In NAD, alert, answering questions intermittently in a soft voice  Eyes: no scleral icterus, pupils reactive, right 4 mm left 3 mm  Neck: supple, no JVD  Cardiovascular: regular rate without MRG; 2+ peripheral pulses  Respiratory: CTA biL, good air movement without wheezing, rhonchi or crackled  Abdomen: soft, non tender  Skin: no rashes  Musculoskeletal: no peripheral edema  Neurologic: extensive rigidity noted, moves all 4, follows commands, strength appears symmetric and with generalized weakness  Labs on Admission:  Basic Metabolic Panel:  Recent Labs Lab 07/26/14 1352  NA 139  K 4.5  CL 102  CO2 26  GLUCOSE 76  BUN 22  CREATININE 0.59  CALCIUM 8.7   CBC:  Recent Labs Lab 07/26/14 1352  WBC 7.0  NEUTROABS 5.4  HGB 11.2*  HCT 35.6*  MCV 84.2  PLT 176   Cardiac Enzymes:  Recent Labs Lab 07/26/14 1352  TROPONINI <0.30   Radiological Exams on Admission: Dg Abd Acute W/chest  07/26/2014   CLINICAL DATA:  Loss of consciousness.  EXAM: ACUTE ABDOMEN SERIES (ABDOMEN 2 VIEW & CHEST 1 VIEW)  COMPARISON:  April 26, 2011.  FINDINGS: Stable cardiomediastinal silhouette. Stable elevated right hemidiaphragm is noted. No pneumothorax or pleural effusion is noted. No acute pulmonary disease is noted. Status post intra  medullary fixation of old proximal right humeral fracture. There is no evidence of bowel obstruction or ileus. Moderate levoscoliosis of lumbar spine is noted with multilevel degenerative disc disease. Large amount of stool is noted in the rectum concerning for impaction. No pneumoperitoneum is noted. No abnormal calcifications are noted.  IMPRESSION: No acute cardiopulmonary abnormality seen. Large amount of stool noted in the rectum concerning for impaction.   Electronically Signed   By: Roque Lias M.D.   On: 07/26/2014 13:33    EKG: Independently reviewed. LBBB, seen on previous tracings.   Assessment/Plan Active Problems:   Syncope   Parkinson disease  Syncopal episode - no apparent inciting factors as patient was at baseline, difficult to obtain story from patient and she does not complain of anything in the ED. Obtain CT head, observe on telemetry, check CEs overnight. She has a LBBB, chronic. Obtain 2D echo and carotid dopplers to r/o structural disease and severe stenosis. Parkinson's disease - she seem to have advanced disease, resume home medications. I am not clear to what degree she has underlying dementia, but communicating little in the ED.  Acute encephalopathy - she is AxO to name, DOB but not year. She is very selective into what she answers and per family she does that at home. Per family she is very close to baseline. Constipation - large stool burden, Miralax, Senna. ? A vasovagal episode as the cause HTN - apparently has a history of HTN but has been taken off of her medications in the past due to being normotensive. Hydralazine PRN.  Diet: soft  Fluids: none DVT Prophylaxis: SCD  Code Status: Full Family Communication: d/w daughter and caregiver bedside Disposition Plan: obs  Time spent: 36  This note has been created with Education officer, environmental. Any transcriptional errors are unintentional.   Agatha Duplechain M. Elvera Lennox, MD [facility] Pager [phone]  If 7PM-7AM, please contact night-coverage [email or web address] Password [email or web address] 07/26/2014, 4:07 PM

## 2014-07-26 NOTE — Progress Notes (Signed)
Patient disimpacted in ED per patient's daughter and caregiver.

## 2014-07-26 NOTE — Progress Notes (Signed)
Spoke to Dr. Elvera LennoxGherghe, he stated that patient is NOT being admitted for CVA workup, her admitting diagnosis is SYNCOPE.

## 2014-07-26 NOTE — ED Provider Notes (Signed)
CSN: [id number]     Arrival date & time 07/26/14  1202 History   First MD Initiated Contact with Patient 07/26/14 1212     Chief Complaint  Patient presents with  . Loss of Consciousness     (Consider location/radiation/quality/duration/timing/severity/associated sxs/prior Treatment) HPI   Shawna Torres is a 80 y.o. female brought in by EMS for syncopal event. The patient has severe Parkinson's and is cared for at home by her daughter and a caregiver. As per caregiver: Patient went apneic and limp. This is the second time this has happened. Initially the caregiver blue on the patient's face, this did not have any change in the patient's condition, she felt his chest several times and the patient started breathing. As per the caregivers think she was operating for about a minute, she denies any cyanosis. Patient's been in her normal state of health, however she has not had a bowel movement in several days. Patient is intermittently verbal at her baseline. Level V cavity secondary to dementia.  Past Medical History  Diagnosis Date  . Parkinson disease    History reviewed. No pertinent past surgical history. No family history on file. History  Substance Use Topics  . Smoking status: Never Smoker   . Smokeless tobacco: Not on file  . Alcohol Use: No   OB History   Grav Para Term Preterm Abortions TAB SAB Ect Mult Living                 Review of Systems  10 systems reviewed and found to be negative, except as noted in the HPI.   Allergies  Review of patient's allergies indicates no known allergies.  Home Medications   Prior to Admission medications   Medication Sig Start Date End Date Taking? Authorizing Provider  carbidopa-levodopa (PARCOPA) 25-100 MG per disintegrating tablet Take 1-2 tablets by mouth 2 (two) times daily. Takes 1 tablet in the am and 2 tablets in the pm.   Yes Historical Provider, MD  folic acid (FOLVITE) 400 MCG tablet Take 400 mcg by mouth daily.   Yes  Historical Provider, MD  Multiple Vitamin (MULTIVITAMIN WITH MINERALS) TABS Take 2 tablets by mouth daily.   Yes Historical Provider, MD  omeprazole (PRILOSEC) 20 MG capsule Take 20 mg by mouth daily.   Yes Historical Provider, MD  vitamin B-12 (CYANOCOBALAMIN) 1000 MCG tablet Take 1,000 mcg by mouth daily.   Yes Historical Provider, MD  vitamin E 400 UNIT capsule Take 400 Units by mouth daily.   Yes Historical Provider, MD   BP 210/90  Pulse 78  Temp(Src) 97.8 F (36.6 C) (Oral)  Resp 16  SpO2 97% Physical Exam  HENT:  Head: Normocephalic and atraumatic.  Mouth/Throat: Oropharynx is clear and moist.  Eyes: Conjunctivae are normal. Pupils are equal, round, and reactive to light.  Cardiovascular: Normal rate, regular rhythm and intact distal pulses.   Pulmonary/Chest: Effort normal and breath sounds normal. No respiratory distress. She has no wheezes. She has no rales. She exhibits no tenderness.  Abdominal: Soft. Bowel sounds are normal. She exhibits no distension and no mass. There is no tenderness. There is no rebound and no guarding.  Genitourinary:  Disimpaction chaperoned by technician: There is a soft normally colored stool in the vault.  Neurological:  Intermittently verbal, does not follow commands,    ED Course  Procedures (including critical care time) Labs Review Labs Reviewed  CBC WITH DIFFERENTIAL - Abnormal; Notable for the following:    Hemoglobin 11.2 (*)  HCT 35.6 (*)    RDW 15.9 (*)    All other components within normal limits  BASIC METABOLIC PANEL - Abnormal; Notable for the following:    GFR calc non Af Amer 84 (*)    All other components within normal limits  TROPONIN I  URINALYSIS, ROUTINE W REFLEX MICROSCOPIC  TROPONIN I  TROPONIN I    Imaging Review Dg Abd Acute W/chest  07/26/2014   CLINICAL DATA:  Loss of consciousness.  EXAM: ACUTE ABDOMEN SERIES (ABDOMEN 2 VIEW & CHEST 1 VIEW)  COMPARISON:  April 26, 2011.  FINDINGS: Stable  cardiomediastinal silhouette. Stable elevated right hemidiaphragm is noted. No pneumothorax or pleural effusion is noted. No acute pulmonary disease is noted. Status post intra medullary fixation of old proximal right humeral fracture. There is no evidence of bowel obstruction or ileus. Moderate levoscoliosis of lumbar spine is noted with multilevel degenerative disc disease. Large amount of stool is noted in the rectum concerning for impaction. No pneumoperitoneum is noted. No abnormal calcifications are noted.  IMPRESSION: No acute cardiopulmonary abnormality seen. Large amount of stool noted in the rectum concerning for impaction.   Electronically Signed   By: Roque LiasJames  Green M.D.   On: 07/26/2014 13:33     EKG Interpretation   Date/Time:  Friday July 26 2014 12:14:14 EDT Ventricular Rate:  69 PR Interval:  156 QRS Duration: 134 QT Interval:  433 QTC Calculation: 464 R Axis:   -43 Text Interpretation:  Sinus rhythm Left bundle branch block Artifact in  lead(s) I II aVR aVL aVF and baseline wander in lead(s) I III aVL No  significant change since last tracing Confirmed by HARRISON  MD, FORREST  ([id number]) on 07/26/2014 3:30:01 PM      MDM   Final diagnoses:  None    Filed Vitals:   07/26/14 1445 07/26/14 1515 07/26/14 1545 07/26/14 1633  BP: 187/80 179/83 172/81 210/90  Pulse: 90 69 72 78  Temp:    97.8 F (36.6 C)  TempSrc:    Oral  Resp: 15 13 16 16   SpO2: 100% 98% 99% 97%    Medications  sodium chloride 0.9 % injection 3 mL (not administered)  pantoprazole (PROTONIX) EC tablet 40 mg (not administered)  vitamin B-12 (CYANOCOBALAMIN) tablet 1,000 mcg (not administered)  vitamin E capsule 400 Units (not administered)  carbidopa-levodopa (PARCOPA) 25-100 MG per disintegrating tablet 1-2 tablet (not administered)  folic acid (FOLVITE) tablet 400 mcg (not administered)  multivitamin with minerals tablet 2 tablet (not administered)  polyethylene glycol (MIRALAX / GLYCOLAX) packet  17 g (not administered)  Glycerin (Adult) 2.1 G suppository 1 suppository (not administered)  senna-docusate (Senokot-S) tablet 1 tablet (not administered)  hydrALAZINE (APRESOLINE) injection 2 mg (not administered)    Shawna Torres is a 80 y.o. female presenting with syncopal and possibly apneic event. Patient was given several precordial thumbs and returned to consciousness and breathing. EKG with no she make changes or arrhythmia. Blood work and urinalysis reassuring. Acute abdominal series shows a large stool burden with possible impaction in the rectal vault. On my digital rectal exam there is soft stool, no impaction. Patient will be admitted to a telemetry bed for syncope workup.    Wynetta Emeryicole Luvena Wentling, PA-C 07/26/14 1704

## 2014-07-26 NOTE — ED Notes (Signed)
Brought [facility] ED via [facility] for eval after having witnessed syncopal episode with period of apnea. Pt's caregiver thumped pt in chest and pt became responsive to baseline again.

## 2014-07-27 ENCOUNTER — Observation Stay (HOSPITAL_COMMUNITY): Payer: Medicare Other

## 2014-07-27 DIAGNOSIS — R6251 Failure to thrive (child): Secondary | ICD-10-CM | POA: Insufficient documentation

## 2014-07-27 DIAGNOSIS — K5900 Constipation, unspecified: Secondary | ICD-10-CM | POA: Diagnosis present

## 2014-07-27 DIAGNOSIS — G20 Parkinson's disease: Secondary | ICD-10-CM

## 2014-07-27 DIAGNOSIS — R627 Adult failure to thrive: Secondary | ICD-10-CM | POA: Diagnosis present

## 2014-07-27 DIAGNOSIS — R55 Syncope and collapse: Secondary | ICD-10-CM

## 2014-07-27 DIAGNOSIS — I10 Essential (primary) hypertension: Secondary | ICD-10-CM

## 2014-07-27 LAB — TROPONIN I: Troponin I: <0.3 ng/mL (ref <0.30)

## 2014-07-27 MED ORDER — POLYETHYLENE GLYCOL 3350 17 G PO PACK: 17.0000 g | PACK | Freq: Two times a day (BID) | ORAL | Status: DC

## 2014-07-27 MED ORDER — AMLODIPINE BESYLATE 5 MG PO TABS
5.0000 mg | ORAL_TABLET | Freq: Every day | ORAL | Status: DC
  Administered 2014-07-27: 5 mg via ORAL
  Filled 2014-07-27: qty 1

## 2014-07-27 MED ORDER — SENNOSIDES-DOCUSATE SODIUM 8.6-50 MG PO TABS: 1.0000 | ORAL_TABLET | Freq: Every day | ORAL | Status: DC

## 2014-07-27 MED ORDER — ENSURE COMPLETE PO LIQD: 237.0000 mL | Freq: Two times a day (BID) | ORAL | Status: DC

## 2014-07-27 MED ORDER — AMLODIPINE BESYLATE 5 MG PO TABS: 5.0000 mg | ORAL_TABLET | Freq: Every day | ORAL | Status: DC

## 2014-07-27 MED ORDER — ENSURE COMPLETE PO LIQD
237.0000 mL | Freq: Two times a day (BID) | ORAL | Status: DC
  Administered 2014-07-27: 237 mL via ORAL

## 2014-07-27 MED ORDER — GLYCERIN (LAXATIVE) 2.1 G RE SUPP: 1.0000 | RECTAL | Status: DC | PRN

## 2014-07-27 NOTE — Progress Notes (Signed)
UR Completed.  Jonothan Heberle Jane [phone] 07/27/2014  

## 2014-07-27 NOTE — Discharge Summary (Signed)
Physician Discharge Summary  Patient ID: Shawna Torres MRN: [id number] DOB/AGE: 07-20-1932 80 y.o.  Admit date: 07/26/2014 Discharge date: 07/27/2014  Primary Care Physician:  Pamelia Hoit, MD  Discharge Diagnoses:   . Syncope  . Parkinson disease . Unspecified constipation . Adult failure to thrive . Essential hypertension, benign  Consults:  None   Recommendations for Outpatient Follow-up:  Patient needs outpatient ophthalmological examination, concern for retinal detachment in the right globe, explained to the patient's daughter  Patient's MRI had shown moderate ventricular dilatation, nonobstructing hydrocephalus with progressive changes over the last 6 years, recommend outpatient neurology referral  Allergies:  No Known Allergies   Discharge Medications:   Medication List         amLODipine 5 MG tablet  Commonly known as:  NORVASC  Take 1 tablet (5 mg total) by mouth daily.     carbidopa-levodopa 25-100 MG per disintegrating tablet  Commonly known as:  PARCOPA  Take 1-2 tablets by mouth 2 (two) times daily. Takes 1 tablet in the am and 2 tablets in the pm.     feeding supplement (ENSURE COMPLETE) Liqd  Take 237 mLs by mouth 2 (two) times daily between meals.     folic acid 400 MCG tablet  Commonly known as:  FOLVITE  Take 400 mcg by mouth daily.     Glycerin (Adult) 2.1 G Supp  Place 1 suppository rectally as needed for moderate constipation or severe constipation.     multivitamin with minerals Tabs tablet  Take 2 tablets by mouth daily.     omeprazole 20 MG capsule  Commonly known as:  PRILOSEC  Take 20 mg by mouth daily.     polyethylene glycol packet  Commonly known as:  MIRALAX / GLYCOLAX  Take 17 g by mouth 2 (two) times daily.     senna-docusate 8.6-50 MG per tablet  Commonly known as:  Senokot-S  Take 1 tablet by mouth at bedtime.     vitamin B-12 1000 MCG tablet  Commonly known as:  CYANOCOBALAMIN  Take 1,000 mcg by mouth daily.      vitamin E 400 UNIT capsule  Take 400 Units by mouth daily.         Brief H and P: For complete details please refer to admission H and P, but in brief Shawna Torres is a 80 y.o. female has a past medical history significant for Parkinson's disease, mild cognitive impairment per family she is AxOx4 most days, is being brought to the ER after a sudden episode of syncope. Patient is non ambulatory and bed bound and her caretaker tells me that she had a sudden episode of unresponsiveness this morning that lasted for ~1 minute. Caregiver states that it appeared that the patient stopped breathing, did not check a pulse. She provided patient 1-2 thumps then patient recovered without apparent deficits or confusion. There are no reported symptoms and patient did not complain of anything prior to that. No reports of chest pain, fevers. As she had her syncope, there were no seizure-like motor activity or bowel or bladder incontinence. In the ER patient close to baseline, mild anemia on CBC but labs essentially unrevealing. Urinalysis clear and CXR without acute findings.      Hospital Course:  Syncope: Possibly vasovagal versus autonomic dysfunction from Parkinson's disease. Patient had no chest pain, any diaphoresis, no seizure-like activity. Patient was admitted for observation for further workup. She was ruled out for acute ACS with negative cardiac enzymes. EKG showed left  bundle branch block, no prior EKG to compare with. Patient had no cardiac symptoms.  CT head negative for acute intracranial abnormality, showed atrophy with small vessel ischemic changes, moderate ventriculomegaly. MRI of the brain showed no acute infarct, moderate ventriculomegaly, progressive chronic hydrocephalus, recommend outpatient neurology referral. EEG was normal with no acute epileptiform activity suggestive of seizure. 2-D echo showed EF 50-55%, grade 1 diastolic dysfunction, no regional wall abnormalities.  UA showed no  UTI MRI of the brain incidentally also showed possible concern for retinal detachment in the right globe, explained to the daughter for ophthalmology examination outpatient.  Parkinson disease with dementia  - Continue Sinemet   Unspecified constipation  - Patient was disimpacted in the ER,  patient started on daily bowel regimen with MiraLax, Senokot and glycerin suppository as needed   Adult failure to thrive  - Place on nutritional supplements, currently wheelchair-bound at home. She has caregiver.  Hypertension:   Place on Norvasc 5 mg daily     Day of Discharge BP 177/54  Pulse 66  Temp(Src) 98.7 F (37.1 C) (Oral)  Resp 18  Ht 5\' 2"  (1.575 m)  Wt 47.945 kg (105 lb 11.2 oz)  BMI 19.33 kg/m2  SpO2 90%  Physical Exam: General: awake, at her baseline per her family at bedside CVS: S1-S2 clear no murmur rubs or gallops Chest: clear to auscultation bilaterally, no wheezing rales or rhonchi Abdomen: soft nontender, nondistended, normal bowel sounds Extremities: no cyanosis, clubbing or edema noted bilaterally    The results of significant diagnostics from this hospitalization (including imaging, microbiology, ancillary and laboratory) are listed below for reference.    LAB RESULTS: Basic Metabolic Panel:  Recent Labs Lab 07/26/14 1352  NA 139  K 4.5  CL 102  CO2 26  GLUCOSE 76  BUN 22  CREATININE 0.59  CALCIUM 8.7   Liver Function Tests: No results found for this basename: AST, ALT, ALKPHOS, BILITOT, PROT, ALBUMIN,  in the last 168 hours No results found for this basename: LIPASE, AMYLASE,  in the last 168 hours No results found for this basename: AMMONIA,  in the last 168 hours CBC:  Recent Labs Lab 07/26/14 1352  WBC 7.0  NEUTROABS 5.4  HGB 11.2*  HCT 35.6*  MCV 84.2  PLT 176   Cardiac Enzymes:  Recent Labs Lab 07/27/14 0253 07/27/14 0755  TROPONINI <0.30 <0.30   BNP: No components found with this basename: POCBNP,  CBG: No results  found for this basename: GLUCAP,  in the last 168 hours  Significant Diagnostic Studies:  Ct Head Wo Contrast  07/26/2014   CLINICAL DATA:  Syncope, history of Parkinson's  EXAM: CT HEAD WITHOUT CONTRAST  TECHNIQUE: Contiguous axial images were obtained from the base of the skull through the vertex without intravenous contrast.  COMPARISON:  04/11/2011  FINDINGS: No evidence of parenchymal hemorrhage or extra-axial fluid collection. No mass lesion, mass effect, or midline shift.  No CT evidence of acute infarction.  Extensive small vessel ischemic changes. Intracranial atherosclerosis.  Global cortical and central atrophy with secondary ventriculomegaly, similar versus minimally increased from 2012.  The visualized paranasal sinuses are essentially clear. Partial opacification of the right mastoid air cells.  Stable nondisplaced bilateral nasal bone fractures. No evidence of calvarial fracture.  IMPRESSION: No evidence of acute intracranial abnormality.  Atrophy with small vessel ischemic changes and intracranial atherosclerosis.  Moderate ventriculomegaly, stable versus minimally increased from 2012.   Electronically Signed   By: Charline Bills M.D.   On:  07/26/2014 18:24   Mr Brain Wo Contrast  07/27/2014   CLINICAL DATA:  Sudden onset of syncope. The patient subtly became unresponsive for approximately 1 min.  EXAM: MRI HEAD WITHOUT CONTRAST  TECHNIQUE: Multiplanar, multiecho pulse sequences of the brain and surrounding structures were obtained without intravenous contrast.  COMPARISON:  CT head without contrast 07/26/2014, 04/11/2011, and 02/13/2008.  FINDINGS: Diffusion-weighted images demonstrate no evidence for acute or subacute infarction. Advanced atrophy and white matter disease is present bilaterally. As previously stated, the ventricles are larger than on previous studies of 2012 and 2009. Periventricular changes are similar to the 2012 study there transependymal CSF is still considered. Dilated  perivascular spaces are noted in the basal ganglia.  No mass lesion or hemorrhage is present.  Abnormal signal is present in the left vertebral artery which may represent slow or occluded flow. The right vertebral artery is patent. The basilar artery is small. Bilateral fetal type posterior cerebral arteries are present. Flow is present in the anterior circulation but laterally.  A 4 x 6 mm nodular lesion is noted just inferior and lateral to the left optic disc. A septation within the right globe suggests a retinal detachment. Recommend ophthalmological exam for these findings.  The paranasal sinuses are clear. Bilateral mastoid effusions are worse on the right.  IMPRESSION: 1. Moderate ventricular dilation. This could represent nonobstructing hydrocephalus with progressive changes over the last 6 years. 2. Extensive periventricular white matter disease. This mostly represents scratch the this most likely represents chronic microvascular ischemic change. A component of transependymal CSF flow is not excluded. 3. 6 mm nodular density on along the posterior left globe could represent a focal neoplasm or metastasis. 4. Septation of the right globe is concerning for retinal detachment. Recommend ophthalmological exam for both lesions. 5. Bilateral mastoid effusions are worse on the right. No obstructing at nasopharyngeal lesion is evident.   Electronically Signed   By: Gennette Pac M.D.   On: 07/27/2014 11:49   Dg Abd Acute W/chest  07/26/2014   CLINICAL DATA:  Loss of consciousness.  EXAM: ACUTE ABDOMEN SERIES (ABDOMEN 2 VIEW & CHEST 1 VIEW)  COMPARISON:  April 26, 2011.  FINDINGS: Stable cardiomediastinal silhouette. Stable elevated right hemidiaphragm is noted. No pneumothorax or pleural effusion is noted. No acute pulmonary disease is noted. Status post intra medullary fixation of old proximal right humeral fracture. There is no evidence of bowel obstruction or ileus. Moderate levoscoliosis of lumbar spine is  noted with multilevel degenerative disc disease. Large amount of stool is noted in the rectum concerning for impaction. No pneumoperitoneum is noted. No abnormal calcifications are noted.  IMPRESSION: No acute cardiopulmonary abnormality seen. Large amount of stool noted in the rectum concerning for impaction.   Electronically Signed   By: Roque Lias M.D.   On: 07/26/2014 13:33    2D ECHO: Study Conclusions  - Left ventricle: The cavity size was normal. There was moderate concentric hypertrophy. Systolic function was normal. The estimated ejection fraction was in the range of 50% to 55%. Wall motion was normal; there were no regional wall motion abnormalities. Doppler parameters are consistent with abnormal left ventricular relaxation (grade 1 diastolic dysfunction). - Ventricular septum: Septal motion showed dyssynergy. These changes are consistent with a left bundle branch block. - Mitral valve: Mildly calcified annulus. There was mild regurgitation.    Disposition and Follow-up: Discharge Instructions   Diet - low sodium heart healthy    Complete by:  As directed  Discharge instructions    Complete by:  As directed   Discharge diet: SOFT     Increase activity slowly    Complete by:  As directed             DISPOSITION: home   DIET: SOFT diet   DISCHARGE FOLLOW-UP Follow-up Information   Follow up with Pamelia HoitWILSON,FRED HENRY, MD. Schedule an appointment as soon as possible for a visit in 2 weeks. (for hospital follow-up)    Specialty:  Family Medicine   Contact information:   [address] [place] [id number] [phone]       Time spent on Discharge: 40 mins  Signed:   Kady Toothaker M.D. [facility] 07/27/2014, 1:47 PM Pager: [phone]   **Disclaimer: This note was dictated with voice recognition software. Similar sounding words can inadvertently be transcribed and this note may contain transcription errors which may not have been corrected  upon publication of note.**

## 2014-07-27 NOTE — Progress Notes (Signed)
  Echocardiogram 2D Echocardiogram has been performed.  Shawna Torres, Starkisha Tullis 07/27/2014, 8:59 AM

## 2014-07-27 NOTE — ED Provider Notes (Signed)
Medical screening examination/treatment/procedure(s) were conducted as a shared visit with non-physician practitioner(s) and myself.  I personally evaluated the patient during the encounter.   EKG Interpretation   Date/Time:  Friday July 26 2014 12:14:14 EDT Ventricular Rate:  69 PR Interval:  156 QRS Duration: 134 QT Interval:  433 QTC Calculation: 464 R Axis:   -43 Text Interpretation:  Sinus rhythm Left bundle branch block Artifact in  lead(s) I II aVR aVL aVF and baseline wander in lead(s) I III aVL No  significant change since last tracing Confirmed by Niharika Savino  MD, Rikita Grabert  ([id number]) on 07/26/2014 3:30:01 PM      I interviewed and examined the patient. Lungs are CTAB. Cardiac exam wnl. Abdomen soft.  Questionable syncopal vs apneic event at home. Plan for admission.   Junius ArgyleForrest S Ltanya Bayley, MD 07/27/14 [phone]

## 2014-07-27 NOTE — Procedures (Addendum)
ELECTROENCEPHALOGRAM REPORT   Patient: Shawna Torres       Room #: 1O103W27 EEG No. ID: [id number] Age: 80 y.o.        Sex: female Referring Physician: Rai Report Date:  07/27/2014        Interpreting Physician: Thana FarrEYNOLDS,Takyia Sindt D  History: Shawna BusmanJo Ann Bakos is an 80 y.o. female with syncope evaluated to rule out seizure  Medications:  Scheduled: . amLODipine  5 mg Oral Daily  . carbidopa-levodopa  1 tablet Oral q morning - 10a  . carbidopa-levodopa  2 tablet Oral QHS  . feeding supplement (ENSURE COMPLETE)  237 mL Oral BID BM  . folic acid  500 mcg Oral Daily  . multivitamin with minerals  2 tablet Oral Daily  . pantoprazole  40 mg Oral Daily  . polyethylene glycol  17 g Oral BID  . senna-docusate  1 tablet Oral QHS  . sodium chloride  3 mL Intravenous Q12H  . vitamin B-12  1,000 mcg Oral Daily  . vitamin E  400 Units Oral Daily    Conditions of Recording:  This is a 16 channel EEG carried out with the patient in the awake and drowsy states.  Description:  The background activity consists of a low voltage, symmetrical, fairly well organized, maximum 6 Hz theta activity, seen from the parieto-occipital and posterior temporal regions.  Often slower rhythms are seen as a posterior background rhythm.  Low voltage fast activity, poorly organized, is seen anteriorly only on rare occasions and is at times superimposed on more posterior regions.  Theta rhythms are seen from the central and temporal regions. The patient drowses with slowing to irregular, low voltage theta and delta activity.   Stage II sleep is not obtained. Hyperventilation and intermittent photic stimulation were not performed.  IMPRESSION: This EEG is characterized by slowing which is consistent with normal drowse.  Can not rule out the possibility of slowing related to general cerebral disturbance such as a metabolic encephalopathy or static encephalopathy (dementia).  Clinical correlation recommended.  No epileptiform activity is  noted.       Thana FarrLeslie Lavada Langsam, MD [facility] [phone] 07/27/2014, 1:25 PM

## 2014-07-27 NOTE — Progress Notes (Addendum)
Patient ID: Shawna BusmanJo Ann Torres  female  ZOX:[id number]    DOB: 01/03/1932    DOA: 07/26/2014  PCP: Pamelia HoitWILSON,FRED HENRY, MD  Assessment/Plan: Principal Problem:   Syncope: Possibly vasovagal versus autonomic dysfunction from Parkinson's disease. Patient had no chest pain, any diaphoresis, no seizure-like activity - Await 2-D echo, EEG, carotid Doppler, MRI brain. - UA negative for UTI  - CT head negative for acute intracranial abnormality, showed atrophy with small vessel ischemic changes, moderate ventriculomegaly  Active Problems:   Parkinson disease with dementia - Continue Sinemet    Unspecified constipation - Patient was disimpacted in the ER, continue bowel regimen    Adult failure to thrive - Place on nutritional supplements, PT, OT  Hypertension: Place on Norvasc 5 mg daily    DVT Prophylaxis:  Code Status: full  Family Communication: Discussed in detail with patient's daughter at the bedside  Disposition: Likely tomorrow  Consultants:  None  Procedures:  None  Antibiotics:  None    Subjective: Patient seen, too sleepy for full neurological examination, otherwise no complaints, per daughter just recently started sleeping before my encounter  Objective: Weight change:   Intake/Output Summary (Last 24 hours) at 07/27/14 0948 Last data filed at 07/26/14 2100  Gross per 24 hour  Intake    120 ml  Output     14 ml  Net    106 ml   Blood pressure 177/54, pulse 66, temperature 98.7 F (37.1 C), temperature source Oral, resp. rate 18, height 5\' 2"  (1.575 m), weight 47.945 kg (105 lb 11.2 oz), SpO2 90.00%.  Physical Exam: General: Sleepy, not forthcoming with neuro exam CVS: S1-S2 clear, no murmur rubs or gallops Chest: clear to auscultation bilaterally, no wheezing, rales or rhonchi Abdomen: soft nontender, nondistended, normal bowel sounds  Extremities: no cyanosis, clubbing or edema noted bilaterally   Lab Results: Basic Metabolic Panel:  Recent  Labs Lab 07/26/14 1352  NA 139  K 4.5  CL 102  CO2 26  GLUCOSE 76  BUN 22  CREATININE 0.59  CALCIUM 8.7   Liver Function Tests: No results found for this basename: AST, ALT, ALKPHOS, BILITOT, PROT, ALBUMIN,  in the last 168 hours No results found for this basename: LIPASE, AMYLASE,  in the last 168 hours No results found for this basename: AMMONIA,  in the last 168 hours CBC:  Recent Labs Lab 07/26/14 1352  WBC 7.0  NEUTROABS 5.4  HGB 11.2*  HCT 35.6*  MCV 84.2  PLT 176   Cardiac Enzymes:  Recent Labs Lab 07/26/14 1928 07/27/14 0253 07/27/14 0755  TROPONINI <0.30 <0.30 <0.30   BNP: No components found with this basename: POCBNP,  CBG: No results found for this basename: GLUCAP,  in the last 168 hours   Micro Results: No results found for this or any previous visit (from the past 240 hour(s)).  Studies/Results: Ct Head Wo Contrast  07/26/2014   CLINICAL DATA:  Syncope, history of Parkinson's  EXAM: CT HEAD WITHOUT CONTRAST  TECHNIQUE: Contiguous axial images were obtained from the base of the skull through the vertex without intravenous contrast.  COMPARISON:  04/11/2011  FINDINGS: No evidence of parenchymal hemorrhage or extra-axial fluid collection. No mass lesion, mass effect, or midline shift.  No CT evidence of acute infarction.  Extensive small vessel ischemic changes. Intracranial atherosclerosis.  Global cortical and central atrophy with secondary ventriculomegaly, similar versus minimally increased from 2012.  The visualized paranasal sinuses are essentially clear. Partial opacification of the right mastoid air  cells.  Stable nondisplaced bilateral nasal bone fractures. No evidence of calvarial fracture.  IMPRESSION: No evidence of acute intracranial abnormality.  Atrophy with small vessel ischemic changes and intracranial atherosclerosis.  Moderate ventriculomegaly, stable versus minimally increased from 2012.   Electronically Signed   By: Charline Bills  M.D.   On: 07/26/2014 18:24   Dg Abd Acute W/chest  07/26/2014   CLINICAL DATA:  Loss of consciousness.  EXAM: ACUTE ABDOMEN SERIES (ABDOMEN 2 VIEW & CHEST 1 VIEW)  COMPARISON:  April 26, 2011.  FINDINGS: Stable cardiomediastinal silhouette. Stable elevated right hemidiaphragm is noted. No pneumothorax or pleural effusion is noted. No acute pulmonary disease is noted. Status post intra medullary fixation of old proximal right humeral fracture. There is no evidence of bowel obstruction or ileus. Moderate levoscoliosis of lumbar spine is noted with multilevel degenerative disc disease. Large amount of stool is noted in the rectum concerning for impaction. No pneumoperitoneum is noted. No abnormal calcifications are noted.  IMPRESSION: No acute cardiopulmonary abnormality seen. Large amount of stool noted in the rectum concerning for impaction.   Electronically Signed   By: Roque Lias M.D.   On: 07/26/2014 13:33    Medications: Scheduled Meds: . carbidopa-levodopa  1 tablet Oral q morning - 10a  . carbidopa-levodopa  2 tablet Oral QHS  . folic acid  500 mcg Oral Daily  . multivitamin with minerals  2 tablet Oral Daily  . pantoprazole  40 mg Oral Daily  . polyethylene glycol  17 g Oral BID  . senna-docusate  1 tablet Oral QHS  . sodium chloride  3 mL Intravenous Q12H  . vitamin B-12  1,000 mcg Oral Daily  . vitamin E  400 Units Oral Daily      LOS: 1 day   Anasophia Pecor M.D. [facility] 07/27/2014, 9:48 AM Pager: [phone]  If 7PM-7AM, please contact night-coverage [email or web address] Password [email or web address]  **Disclaimer: This note was dictated with voice recognition software. Similar sounding words can inadvertently be transcribed and this note may contain transcription errors which may not have been corrected upon publication of note.**

## 2014-07-27 NOTE — Progress Notes (Signed)
PT Cancellation Note  Patient Details Name: Shawna Torres MRN: [id number] DOB: 03/17/1932   Cancelled Treatment:    Reason Eval/Treat Not Completed:  (RN reports "you don't need to see her.").  RN reports she has no PT needs and is already d/c'd from hospital. PT signing off. Please re-consult if needed in future.   Shawna Torres, Shawna Torres 07/27/2014, 2:13 PM  Shawna Torres, PT, DPT Pager #: [phone] Office #: [phone]

## 2014-07-27 NOTE — Progress Notes (Signed)
Bilateral carotid artery duplex completed:  1-39% ICA stenosis.  Vertebral artery flow is antegrade.     

## 2014-07-27 NOTE — Progress Notes (Signed)
INITIAL NUTRITION ASSESSMENT  DOCUMENTATION CODES Per approved criteria  -Not Applicable   INTERVENTION: Magic cup TID with meals, each supplement provides 290 kcal and 9 grams of protein  NUTRITION DIAGNOSIS: Unintentional weight loss related to dementia as evidenced by 9% x 8 months.   Goal: Pt to meet >/= 90% of their estimated nutrition needs   Monitor:  PO intake, supplement acceptance  Reason for Assessment: Pt identified as at nutrition risk on the Malnutrition Screen Tool  80 y.o. female  Admitting Dx: Syncope  ASSESSMENT: Pt's daughter and son are at bedside. Per daughter, who lives with pt, she reports that pt either eats everything wanting more or eats very little. She feels pt has lost some weight since the beginning of the year (9% x 8 months).  Ensure gives pt diarrhea. They are interested in magic cups, explained ordering process.   Nutrition Focused Physical Exam:  Subcutaneous Fat:  Orbital Region: WDL Upper Arm Region: WDL Thoracic and Lumbar Region: WDL  Muscle:  Temple Region: WDL Clavicle Bone Region: WDL Clavicle and Acromion Bone Region: WDL Scapular Bone Region: NA Dorsal Hand: WDL Patellar Region: NA pt wheelchair bound Anterior Thigh Region: NA pt wheelchair bound Posterior Calf Region: NA pt wheelchair bound  Edema: not present   Height: Ht Readings from Last 1 Encounters:  07/26/14 5\' 2"  (1.575 m)    Weight: Wt Readings from Last 1 Encounters:  07/27/14 105 lb 11.2 oz (47.945 kg)    Ideal Body Weight: 50 kg   % Ideal Body Weight: 96%  Wt Readings from Last 10 Encounters:  07/27/14 105 lb 11.2 oz (47.945 kg)    Usual Body Weight: 115 lb 1/15  % Usual Body Weight: 91%  BMI:  Body mass index is 19.33 kg/(m^2).  Estimated Nutritional Needs: Kcal: 1300-1500 Protein: 60-70 grams Fluid: > 1.5 L/day  Skin: left hand skin tear, wound right foot  Diet Order: [place] Meal Completion: 0-100%  EDUCATION NEEDS: -No  education needs identified at this time   Intake/Output Summary (Last 24 hours) at 07/27/14 1055 Last data filed at 07/26/14 2100  Gross per 24 hour  Intake    120 ml  Output     14 ml  Net    106 ml    Last BM: 7/31   Labs:   Recent Labs Lab 07/26/14 1352  NA 139  K 4.5  CL 102  CO2 26  BUN 22  CREATININE 0.59  CALCIUM 8.7  GLUCOSE 76    CBG (last 3)  No results found for this basename: GLUCAP,  in the last 72 hours  Scheduled Meds: . amLODipine  5 mg Oral Daily  . carbidopa-levodopa  1 tablet Oral q morning - 10a  . carbidopa-levodopa  2 tablet Oral QHS  . feeding supplement (ENSURE COMPLETE)  237 mL Oral BID BM  . folic acid  500 mcg Oral Daily  . multivitamin with minerals  2 tablet Oral Daily  . pantoprazole  40 mg Oral Daily  . polyethylene glycol  17 g Oral BID  . senna-docusate  1 tablet Oral QHS  . sodium chloride  3 mL Intravenous Q12H  . vitamin B-12  1,000 mcg Oral Daily  . vitamin E  400 Units Oral Daily    Continuous Infusions:   Past Medical History  Diagnosis Date  . Parkinson disease     History reviewed. No pertinent past surgical history.  Kendell BaneHeather Anup Brigham RD, LDN, CNSC [phone] Pager [phone] After Hours Pager

## 2014-07-27 NOTE — Progress Notes (Signed)
EEG Completed; Results Pending  

## 2014-12-22 ENCOUNTER — Other Ambulatory Visit: Payer: Self-pay | Admitting: Internal Medicine

## 2015-01-15 ENCOUNTER — Other Ambulatory Visit: Payer: Self-pay | Admitting: Internal Medicine

## 2015-07-11 ENCOUNTER — Emergency Department (HOSPITAL_COMMUNITY): Payer: Medicare Other

## 2015-07-11 ENCOUNTER — Encounter (HOSPITAL_COMMUNITY): Payer: Self-pay | Admitting: Emergency Medicine

## 2015-07-11 ENCOUNTER — Emergency Department (HOSPITAL_COMMUNITY)
Admission: EM | Admit: 2015-07-11 | Discharge: 2015-07-12 | Disposition: A | Discharge status: Home / Self Care | Payer: Medicare Other | Attending: Emergency Medicine | Admitting: Emergency Medicine

## 2015-07-11 DIAGNOSIS — G20 Parkinson's disease: Secondary | ICD-10-CM | POA: Insufficient documentation

## 2015-07-11 DIAGNOSIS — R112 Nausea with vomiting, unspecified: Secondary | ICD-10-CM | POA: Insufficient documentation

## 2015-07-11 DIAGNOSIS — R05 Cough: Secondary | ICD-10-CM | POA: Insufficient documentation

## 2015-07-11 DIAGNOSIS — F0390 Unspecified dementia without behavioral disturbance: Secondary | ICD-10-CM | POA: Diagnosis not present

## 2015-07-11 DIAGNOSIS — R109 Unspecified abdominal pain: Secondary | ICD-10-CM | POA: Insufficient documentation

## 2015-07-11 DIAGNOSIS — K920 Hematemesis: Secondary | ICD-10-CM | POA: Diagnosis present

## 2015-07-11 DIAGNOSIS — Z79899 Other long term (current) drug therapy: Secondary | ICD-10-CM | POA: Insufficient documentation

## 2015-07-11 HISTORY — DX: Unspecified dementia, unspecified severity, without behavioral disturbance, psychotic disturbance, mood disturbance, and anxiety: F03.90

## 2015-07-11 LAB — CBC WITH DIFFERENTIAL/PLATELET
BASOS PCT: 0 % (ref 0–1)
Basophils Absolute: 0 10*3/uL (ref 0.0–0.1)
EOS PCT: 0 % (ref 0–5)
Eosinophils Absolute: 0.1 10*3/uL (ref 0.0–0.7)
HCT: 30.7 % — ABNORMAL LOW (ref 36.0–46.0)
HEMOGLOBIN: 9.8 g/dL — AB (ref 12.0–15.0)
LYMPHS ABS: 0.4 10*3/uL — AB (ref 0.7–4.0)
Lymphocytes Relative: 3 % — ABNORMAL LOW (ref 12–46)
MCH: 29.8 pg (ref 26.0–34.0)
MCHC: 31.9 g/dL (ref 30.0–36.0)
MCV: 93.3 fL (ref 78.0–100.0)
Monocytes Absolute: 1 10*3/uL (ref 0.1–1.0)
Monocytes Relative: 6 % (ref 3–12)
NEUTROS ABS: 14 10*3/uL — AB (ref 1.7–7.7)
Neutrophils Relative %: 91 % — ABNORMAL HIGH (ref 43–77)
Platelets: 220 10*3/uL (ref 150–400)
RBC: 3.29 MIL/uL — AB (ref 3.87–5.11)
RDW: 15.9 % — ABNORMAL HIGH (ref 11.5–15.5)
WBC: 15.5 10*3/uL — ABNORMAL HIGH (ref 4.0–10.5)

## 2015-07-11 LAB — TYPE AND SCREEN
ABO/RH(D): O POS
Antibody Screen: NEGATIVE

## 2015-07-11 LAB — POC OCCULT BLOOD, ED: Fecal Occult Bld: NEGATIVE

## 2015-07-11 NOTE — ED Notes (Signed)
Pts family member, daughter, states that the pt is never alert and oriented and that she hardly ever talks.

## 2015-07-11 NOTE — ED Notes (Signed)
Daughter reported that black emesis this afternoon , denies diarrhea .

## 2015-07-11 NOTE — ED Notes (Signed)
Patient transported to X-ray 

## 2015-07-11 NOTE — ED Provider Notes (Signed)
CSN: [id number]     Arrival date & time 07/11/15  2159 History   First MD Initiated Contact with Patient 07/11/15 2219     Chief Complaint  Patient presents with  . Hematemesis     (Consider location/radiation/quality/duration/timing/severity/associated sxs/prior Treatment) Patient is a 81 y.o. female presenting with vomiting.  Emesis Severity:  Moderate Duration:  1 day Timing:  Intermittent Emesis appearance: black material. Progression:  Unchanged Chronicity:  New Recent urination:  Normal Relieved by:  Nothing Worsened by:  Nothing tried Ineffective treatments:  None tried Associated symptoms: abdominal pain and cough   Associated symptoms: no diarrhea and no fever     Past Medical History  Diagnosis Date  . Parkinson disease   . Dementia    History reviewed. No pertinent past surgical history. No family history on file. History  Substance Use Topics  . Smoking status: Never Smoker   . Smokeless tobacco: Not on file  . Alcohol Use: No   OB History    No data available     Review of Systems  Unable to perform ROS: Dementia  Gastrointestinal: Positive for vomiting and abdominal pain. Negative for diarrhea.      Allergies  Review of patient's allergies indicates no known allergies.  Home Medications   Prior to Admission medications   Medication Sig Start Date End Date Taking? Authorizing Provider  amLODipine (NORVASC) 5 MG tablet Take 1 tablet (5 mg total) by mouth daily. 07/27/14   Ripudeep Jenna Luo, MD  azithromycin (ZITHROMAX) 200 MG/5ML suspension Take 12.5 mLs (500 mg total) by mouth daily. 07/12/15   Tomasita Crumble, MD  carbidopa-levodopa (PARCOPA) 25-100 MG per disintegrating tablet Take 1-2 tablets by mouth 2 (two) times daily. Takes 1 tablet in the am and 2 tablets in the pm.    Historical Provider, MD  feeding supplement, ENSURE COMPLETE, (ENSURE COMPLETE) LIQD Take 237 mLs by mouth 2 (two) times daily between meals. 07/27/14   Ripudeep Jenna Luo, MD  folic  acid (FOLVITE) 400 MCG tablet Take 400 mcg by mouth daily.    Historical Provider, MD  Glycerin, Adult, 2.1 G SUPP Place 1 suppository rectally as needed for moderate constipation or severe constipation. 07/27/14   Ripudeep Jenna Luo, MD  Multiple Vitamin (MULTIVITAMIN WITH MINERALS) TABS Take 2 tablets by mouth daily.    Historical Provider, MD  omeprazole (PRILOSEC) 20 MG capsule Take 20 mg by mouth daily.    Historical Provider, MD  ondansetron (ZOFRAN ODT) 4 MG disintegrating tablet  ODT q4 hours prn nausea/vomit 07/12/15   Mirian Mo, MD  polyethylene glycol [facility] / GLYCOLAX) packet Take 17 g by mouth 2 (two) times daily. 07/27/14   Ripudeep Jenna Luo, MD  senna-docusate (SENOKOT-S) 8.6-50 MG per tablet Take 1 tablet by mouth at bedtime. 07/27/14   Ripudeep Jenna Luo, MD  vitamin B-12 (CYANOCOBALAMIN) 1000 MCG tablet Take 1,000 mcg by mouth daily.    Historical Provider, MD  vitamin E 400 UNIT capsule Take 400 Units by mouth daily.    Historical Provider, MD   BP 135/60 mmHg  Pulse 86  Temp(Src) 97.3 F (36.3 C) (Oral)  Resp 17  SpO2 92% Physical Exam  Constitutional: She appears well-developed and well-nourished.  HENT:  Head: Normocephalic and atraumatic.  Right Ear: External ear normal.  Left Ear: External ear normal.  Eyes: Conjunctivae and EOM are normal. Pupils are equal, round, and reactive to light.  Neck: Normal range of motion. Neck supple.  Cardiovascular: Normal rate, regular rhythm,  normal heart sounds and intact distal pulses.   Pulmonary/Chest: Effort normal and breath sounds normal.  Abdominal: Soft. Bowel sounds are normal. There is no tenderness.  Musculoskeletal: Normal range of motion.  Neurological: She is alert. GCS eye subscore is 3. GCS verbal subscore is 4. GCS motor subscore is 6.  Skin: Skin is warm and dry.  Vitals reviewed.   ED Course  Procedures (including critical care time) Labs Review Labs Reviewed  CBC WITH DIFFERENTIAL/PLATELET - Abnormal; Notable  for the following:    WBC 15.5 (*)    RBC 3.29 (*)    Hemoglobin 9.8 (*)    HCT 30.7 (*)    RDW 15.9 (*)    Neutrophils Relative % 91 (*)    Neutro Abs 14.0 (*)    Lymphocytes Relative 3 (*)    Lymphs Abs 0.4 (*)    All other components within normal limits  COMPREHENSIVE METABOLIC PANEL - Abnormal; Notable for the following:    Sodium 132 (*)    Chloride 92 (*)    Glucose, Bld 101 (*)    BUN 33 (*)    Total Protein 6.0 (*)    Albumin 3.3 (*)    GFR calc non Af Amer 59 (*)    All other components within normal limits  LIPASE, BLOOD - Abnormal; Notable for the following:    Lipase 20 (*)    All other components within normal limits  POC OCCULT BLOOD, ED  TYPE AND SCREEN    Imaging Review No results found.   EKG Interpretation   Date/Time:  Friday July 11 2015 22:40:44 EDT Ventricular Rate:  79 PR Interval:  161 QRS Duration: 138 QT Interval:  443 QTC Calculation: 508 R Axis:   -66 Text Interpretation:  Wide-QRS tachycardia Ventricular bigeminy Left  bundle branch block Artifact in lead(s) II III aVL aVF V1 V2 V3 V4 V5 V6  No significant change since last tracing Confirmed by Mirian MoGentry, Matthew  [phone]([id number]) on 07/11/2015 10:53:04 PM      MDM   Final diagnoses:  Non-intractable vomiting with nausea, vomiting of unspecified type    81 y.o. female with pertinent PMH of dementia (baseline verbal, but limited response to questioning) presents with black vomit.  History very limited by dementia.  No abd tenderness.  No ho GI bleed.  Hemoccult negative, normal stool.  Wu as above, pt anemic but within realm of baseline.  No vomiting here.  No fevers at home, no change in bm.  Pt is at her neuro baseline per family.  Wu as above with leukocytosis, hgb at baseline.  No other symptoms, pt actively passing fully formed stool.  No vomiting in department.  Pt did have some signs of potentially PNA on CT scan, will treat with levaquin.  DC home in stable condition.  I have reviewed  all laboratory and imaging studies if ordered as above  1. Non-intractable vomiting with nausea, vomiting of unspecified type         Mirian MoMatthew Gentry, MD 07/14/15 [phone]

## 2015-07-12 ENCOUNTER — Emergency Department (HOSPITAL_COMMUNITY): Payer: Medicare Other

## 2015-07-12 LAB — COMPREHENSIVE METABOLIC PANEL
ALBUMIN: 3.3 g/dL — AB (ref 3.5–5.0)
ALK PHOS: 85 U/L (ref 38–126)
ALT: 18 U/L (ref 14–54)
AST: 30 U/L (ref 15–41)
Anion gap: 9 (ref 5–15)
BUN: 33 mg/dL — AB (ref 6–20)
CALCIUM: 9.1 mg/dL (ref 8.9–10.3)
CO2: 31 mmol/L (ref 22–32)
Chloride: 92 mmol/L — ABNORMAL LOW (ref 101–111)
Creatinine, Ser: 0.89 mg/dL (ref 0.44–1.00)
GFR calc Af Amer: >60 mL/min (ref >60)
GFR calc non Af Amer: 59 mL/min — ABNORMAL LOW (ref >60)
Glucose, Bld: 101 mg/dL — ABNORMAL HIGH (ref 65–99)
Potassium: 4.8 mmol/L (ref 3.5–5.1)
Sodium: 132 mmol/L — ABNORMAL LOW (ref 135–145)
Total Bilirubin: 0.4 mg/dL (ref 0.3–1.2)
Total Protein: 6 g/dL — ABNORMAL LOW (ref 6.5–8.1)

## 2015-07-12 LAB — LIPASE, BLOOD: Lipase: 20 U/L — ABNORMAL LOW (ref 22–51)

## 2015-07-12 MED ORDER — AZITHROMYCIN 200 MG/5ML PO SUSR
500.0000 mg | Freq: Once | ORAL | Status: AC
  Administered 2015-07-12: 500 mg via ORAL
  Filled 2015-07-12: qty 12.5

## 2015-07-12 MED ORDER — IOHEXOL 300 MG/ML  SOLN: 25.0000 mL | INTRAMUSCULAR | Status: AC

## 2015-07-12 MED ORDER — ONDANSETRON 4 MG PO TBDP
ORAL_TABLET | ORAL | Status: DC
Start: 2015-07-12 — End: 2015-11-09

## 2015-07-12 MED ORDER — LEVOFLOXACIN 500 MG PO TABS: 500.0000 mg | ORAL_TABLET | Freq: Every day | ORAL | Status: DC

## 2015-07-12 MED ORDER — IOHEXOL 300 MG/ML  SOLN
100.0000 mL | Freq: Once | INTRAMUSCULAR | Status: AC | PRN
  Administered 2015-07-12: 80 mL via INTRAVENOUS

## 2015-07-12 MED ORDER — AZITHROMYCIN 200 MG/5ML PO SUSR
500.0000 mg | Freq: Every day | ORAL | Status: DC
Start: 2015-07-12 — End: 2015-11-09

## 2015-07-12 MED ORDER — SODIUM CHLORIDE 0.9 % IV BOLUS (SEPSIS)
1000.0000 mL | Freq: Once | INTRAVENOUS | Status: AC
  Administered 2015-07-12: 1000 mL via INTRAVENOUS

## 2015-07-12 NOTE — Discharge Instructions (Signed)

## 2015-07-12 NOTE — ED Provider Notes (Signed)
Family expresses concern that patient cannot take pills. I have ordered liquid azithromycin and provided new prescription.  Tomasita CrumbleAdeleke Luiza Carranco, MD 07/12/15 [phone]

## 2015-07-12 NOTE — ED Notes (Signed)
Patient transported to CT 

## 2015-11-07 ENCOUNTER — Emergency Department (HOSPITAL_COMMUNITY): Payer: Medicare Other

## 2015-11-07 ENCOUNTER — Inpatient Hospital Stay (HOSPITAL_COMMUNITY)
Admission: EM | Admit: 2015-11-07 | Discharge: 2015-11-16 | DRG: 871 | Disposition: A | Discharge status: Hospice | Payer: Medicare Other | Attending: Internal Medicine | Admitting: Internal Medicine

## 2015-11-07 DIAGNOSIS — I10 Essential (primary) hypertension: Secondary | ICD-10-CM | POA: Diagnosis present

## 2015-11-07 DIAGNOSIS — Z66 Do not resuscitate: Secondary | ICD-10-CM | POA: Diagnosis present

## 2015-11-07 DIAGNOSIS — Z79899 Other long term (current) drug therapy: Secondary | ICD-10-CM

## 2015-11-07 DIAGNOSIS — Z7401 Bed confinement status: Secondary | ICD-10-CM

## 2015-11-07 DIAGNOSIS — K117 Disturbances of salivary secretion: Secondary | ICD-10-CM | POA: Diagnosis not present

## 2015-11-07 DIAGNOSIS — G20 Parkinson's disease: Secondary | ICD-10-CM | POA: Diagnosis present

## 2015-11-07 DIAGNOSIS — Z681 Body mass index (BMI) 19 or less, adult: Secondary | ICD-10-CM

## 2015-11-07 DIAGNOSIS — J9622 Acute and chronic respiratory failure with hypercapnia: Secondary | ICD-10-CM | POA: Diagnosis present

## 2015-11-07 DIAGNOSIS — J189 Pneumonia, unspecified organism: Secondary | ICD-10-CM

## 2015-11-07 DIAGNOSIS — K219 Gastro-esophageal reflux disease without esophagitis: Secondary | ICD-10-CM | POA: Diagnosis present

## 2015-11-07 DIAGNOSIS — F0280 Dementia in other diseases classified elsewhere without behavioral disturbance: Secondary | ICD-10-CM | POA: Diagnosis present

## 2015-11-07 DIAGNOSIS — I447 Left bundle-branch block, unspecified: Secondary | ICD-10-CM | POA: Diagnosis present

## 2015-11-07 DIAGNOSIS — E875 Hyperkalemia: Secondary | ICD-10-CM | POA: Diagnosis present

## 2015-11-07 DIAGNOSIS — R64 Cachexia: Secondary | ICD-10-CM | POA: Diagnosis present

## 2015-11-07 DIAGNOSIS — R0602 Shortness of breath: Secondary | ICD-10-CM

## 2015-11-07 DIAGNOSIS — G9340 Encephalopathy, unspecified: Secondary | ICD-10-CM | POA: Diagnosis not present

## 2015-11-07 DIAGNOSIS — Z993 Dependence on wheelchair: Secondary | ICD-10-CM

## 2015-11-07 DIAGNOSIS — J690 Pneumonitis due to inhalation of food and vomit: Secondary | ICD-10-CM | POA: Diagnosis present

## 2015-11-07 DIAGNOSIS — Z7189 Other specified counseling: Secondary | ICD-10-CM | POA: Insufficient documentation

## 2015-11-07 DIAGNOSIS — Z515 Encounter for palliative care: Secondary | ICD-10-CM | POA: Diagnosis present

## 2015-11-07 DIAGNOSIS — A419 Sepsis, unspecified organism: Principal | ICD-10-CM | POA: Diagnosis present

## 2015-11-07 DIAGNOSIS — B348 Other viral infections of unspecified site: Secondary | ICD-10-CM | POA: Diagnosis not present

## 2015-11-07 DIAGNOSIS — J9621 Acute and chronic respiratory failure with hypoxia: Secondary | ICD-10-CM | POA: Diagnosis present

## 2015-11-07 DIAGNOSIS — E871 Hypo-osmolality and hyponatremia: Secondary | ICD-10-CM | POA: Diagnosis present

## 2015-11-07 DIAGNOSIS — J9601 Acute respiratory failure with hypoxia: Secondary | ICD-10-CM | POA: Diagnosis not present

## 2015-11-07 DIAGNOSIS — F329 Major depressive disorder, single episode, unspecified: Secondary | ICD-10-CM | POA: Diagnosis present

## 2015-11-07 DIAGNOSIS — J9600 Acute respiratory failure, unspecified whether with hypoxia or hypercapnia: Secondary | ICD-10-CM | POA: Diagnosis not present

## 2015-11-07 DIAGNOSIS — J181 Lobar pneumonia, unspecified organism: Secondary | ICD-10-CM | POA: Diagnosis not present

## 2015-11-07 DIAGNOSIS — R680 Hypothermia, not associated with low environmental temperature: Secondary | ICD-10-CM | POA: Diagnosis present

## 2015-11-07 DIAGNOSIS — M2450 Contracture, unspecified joint: Secondary | ICD-10-CM | POA: Diagnosis present

## 2015-11-07 DIAGNOSIS — J9690 Respiratory failure, unspecified, unspecified whether with hypoxia or hypercapnia: Secondary | ICD-10-CM | POA: Diagnosis present

## 2015-11-07 DIAGNOSIS — D6489 Other specified anemias: Secondary | ICD-10-CM | POA: Diagnosis not present

## 2015-11-07 DIAGNOSIS — G92 Toxic encephalopathy: Secondary | ICD-10-CM | POA: Diagnosis present

## 2015-11-07 DIAGNOSIS — D638 Anemia in other chronic diseases classified elsewhere: Secondary | ICD-10-CM | POA: Diagnosis present

## 2015-11-07 DIAGNOSIS — E872 Acidosis: Secondary | ICD-10-CM | POA: Diagnosis present

## 2015-11-07 LAB — CBC WITH DIFFERENTIAL/PLATELET
BASOS PCT: 0 %
Basophils Absolute: 0 10*3/uL (ref 0.0–0.1)
EOS ABS: 0.1 10*3/uL (ref 0.0–0.7)
EOS PCT: 1 %
HCT: 30 % — ABNORMAL LOW (ref 36.0–46.0)
Hemoglobin: 9.2 g/dL — ABNORMAL LOW (ref 12.0–15.0)
LYMPHS ABS: 0.7 10*3/uL (ref 0.7–4.0)
Lymphocytes Relative: 8 %
MCH: 24.3 pg — AB (ref 26.0–34.0)
MCHC: 30.7 g/dL (ref 30.0–36.0)
MCV: 79.4 fL (ref 78.0–100.0)
Monocytes Absolute: 0.3 10*3/uL (ref 0.1–1.0)
Monocytes Relative: 4 %
NEUTROS PCT: 87 %
Neutro Abs: 7.5 10*3/uL (ref 1.7–7.7)
PLATELETS: 159 10*3/uL (ref 150–400)
RBC: 3.78 MIL/uL — AB (ref 3.87–5.11)
RDW: 18.3 % — ABNORMAL HIGH (ref 11.5–15.5)
WBC: 8.6 10*3/uL (ref 4.0–10.5)

## 2015-11-07 LAB — URINALYSIS, ROUTINE W REFLEX MICROSCOPIC
Bilirubin Urine: NEGATIVE
Glucose, UA: NEGATIVE mg/dL
Hgb urine dipstick: NEGATIVE
Ketones, ur: 15 mg/dL — AB
Leukocytes, UA: NEGATIVE
NITRITE: NEGATIVE
Protein, ur: NEGATIVE mg/dL
SPECIFIC GRAVITY, URINE: 1.019 (ref 1.005–1.030)
UROBILINOGEN UA: 1 mg/dL (ref 0.0–1.0)
pH: 6 (ref 5.0–8.0)

## 2015-11-07 LAB — GLUCOSE, CAPILLARY
GLUCOSE-CAPILLARY: 177 mg/dL — AB (ref 65–99)
Glucose-Capillary: 160 mg/dL — ABNORMAL HIGH (ref 65–99)

## 2015-11-07 LAB — I-STAT CHEM 8, ED
BUN: 34 mg/dL — ABNORMAL HIGH (ref 6–20)
CALCIUM ION: 1.13 mmol/L (ref 1.13–1.30)
Chloride: 97 mmol/L — ABNORMAL LOW (ref 101–111)
Creatinine, Ser: 0.9 mg/dL (ref 0.44–1.00)
GLUCOSE: 215 mg/dL — AB (ref 65–99)
HCT: 34 % — ABNORMAL LOW (ref 36.0–46.0)
HEMOGLOBIN: 11.6 g/dL — AB (ref 12.0–15.0)
Potassium: 4.9 mmol/L (ref 3.5–5.1)
SODIUM: 132 mmol/L — AB (ref 135–145)
TCO2: 27 mmol/L (ref 0–100)

## 2015-11-07 LAB — POCT I-STAT 3, ART BLOOD GAS (G3+)
ACID-BASE EXCESS: 3 mmol/L — AB (ref 0.0–2.0)
BICARBONATE: 29.1 meq/L — AB (ref 20.0–24.0)
O2 SAT: 100 %
PH ART: 7.401 (ref 7.350–7.450)
TCO2: 31 mmol/L (ref 0–100)
pCO2 arterial: 46 mmHg — ABNORMAL HIGH (ref 35.0–45.0)
pO2, Arterial: 192 mmHg — ABNORMAL HIGH (ref 80.0–100.0)

## 2015-11-07 LAB — COMPREHENSIVE METABOLIC PANEL
ALBUMIN: 3.4 g/dL — AB (ref 3.5–5.0)
ALT: 9 U/L — AB (ref 14–54)
ANION GAP: 9 (ref 5–15)
AST: 48 U/L — ABNORMAL HIGH (ref 15–41)
Alkaline Phosphatase: 92 U/L (ref 38–126)
BUN: 28 mg/dL — ABNORMAL HIGH (ref 6–20)
CHLORIDE: 99 mmol/L — AB (ref 101–111)
CO2: 26 mmol/L (ref 22–32)
CREATININE: 0.87 mg/dL (ref 0.44–1.00)
Calcium: 9 mg/dL (ref 8.9–10.3)
GFR calc non Af Amer: >60 mL/min (ref >60)
GLUCOSE: 211 mg/dL — AB (ref 65–99)
Potassium: 5 mmol/L (ref 3.5–5.1)
SODIUM: 134 mmol/L — AB (ref 135–145)
Total Bilirubin: 0.3 mg/dL (ref 0.3–1.2)
Total Protein: 6 g/dL — ABNORMAL LOW (ref 6.5–8.1)

## 2015-11-07 LAB — PROCALCITONIN: Procalcitonin: <0.1 ng/mL

## 2015-11-07 LAB — I-STAT TROPONIN, ED: TROPONIN I, POC: 0.01 ng/mL (ref 0.00–0.08)

## 2015-11-07 LAB — STREP PNEUMONIAE URINARY ANTIGEN: Strep Pneumo Urinary Antigen: NEGATIVE

## 2015-11-07 LAB — CREATININE, SERUM: Creatinine, Ser: 0.72 mg/dL (ref 0.44–1.00)

## 2015-11-07 LAB — LACTIC ACID, PLASMA: Lactic Acid, Venous: 2.1 mmol/L (ref 0.5–2.0)

## 2015-11-07 LAB — I-STAT CG4 LACTIC ACID, ED: LACTIC ACID, VENOUS: 2.93 mmol/L — AB (ref 0.5–2.0)

## 2015-11-07 LAB — CBG MONITORING, ED: GLUCOSE-CAPILLARY: 183 mg/dL — AB (ref 65–99)

## 2015-11-07 MED ORDER — ETOMIDATE 2 MG/ML IV SOLN
INTRAVENOUS | Status: AC
  Filled 2015-11-07: qty 20

## 2015-11-07 MED ORDER — FENTANYL CITRATE (PF) 100 MCG/2ML IJ SOLN: 50.0000 ug | INTRAMUSCULAR | Status: DC | PRN

## 2015-11-07 MED ORDER — INSULIN ASPART 100 UNIT/ML ~~LOC~~ SOLN
0.0000 [IU] | SUBCUTANEOUS | Status: DC
  Administered 2015-11-07: 2 [IU] via SUBCUTANEOUS
  Administered 2015-11-08 – 2015-11-10 (×2): 1 [IU] via SUBCUTANEOUS

## 2015-11-07 MED ORDER — VANCOMYCIN HCL IN DEXTROSE 750-5 MG/150ML-% IV SOLN
750.0000 mg | INTRAVENOUS | Status: DC
  Administered 2015-11-08 – 2015-11-09 (×2): 750 mg via INTRAVENOUS
  Filled 2015-11-07 (×3): qty 150

## 2015-11-07 MED ORDER — ANTISEPTIC ORAL RINSE SOLUTION (CORINZ): 7.0000 mL | Freq: Four times a day (QID) | OROMUCOSAL | Status: DC

## 2015-11-07 MED ORDER — ANTISEPTIC ORAL RINSE SOLUTION (CORINZ)
7.0000 mL | Freq: Four times a day (QID) | OROMUCOSAL | Status: DC
  Administered 2015-11-08 – 2015-11-16 (×26): 7 mL via OROMUCOSAL

## 2015-11-07 MED ORDER — LIDOCAINE HCL (PF) 4 % IJ SOLN
5.0000 mL | Freq: Once | INTRAMUSCULAR | Status: AC
  Administered 2015-11-07: 5 mL
  Filled 2015-11-07: qty 5

## 2015-11-07 MED ORDER — ASPIRIN 81 MG PO CHEW: 324.0000 mg | CHEWABLE_TABLET | ORAL | Status: AC

## 2015-11-07 MED ORDER — HEPARIN SODIUM (PORCINE) 5000 UNIT/ML IJ SOLN: 5000.0000 [IU] | Freq: Three times a day (TID) | INTRAMUSCULAR | Status: DC

## 2015-11-07 MED ORDER — SODIUM CHLORIDE 0.9 % IV SOLN: 250.0000 mL | INTRAVENOUS | Status: DC | PRN

## 2015-11-07 MED ORDER — SODIUM CHLORIDE 0.9 % IV BOLUS (SEPSIS)
500.0000 mL | INTRAVENOUS | Status: AC
  Administered 2015-11-07: 500 mL via INTRAVENOUS

## 2015-11-07 MED ORDER — HEPARIN SODIUM (PORCINE) 5000 UNIT/ML IJ SOLN
5000.0000 [IU] | Freq: Three times a day (TID) | INTRAMUSCULAR | Status: DC
  Administered 2015-11-07 – 2015-11-14 (×21): 5000 [IU] via SUBCUTANEOUS
  Filled 2015-11-07 (×22): qty 1

## 2015-11-07 MED ORDER — CHLORHEXIDINE GLUCONATE 0.12% ORAL RINSE (MEDLINE KIT): 15.0000 mL | Freq: Two times a day (BID) | OROMUCOSAL | Status: DC

## 2015-11-07 MED ORDER — LIDOCAINE HCL (CARDIAC) 20 MG/ML IV SOLN
INTRAVENOUS | Status: AC
  Filled 2015-11-07: qty 5

## 2015-11-07 MED ORDER — SODIUM CHLORIDE 0.9 % IV SOLN: INTRAVENOUS | Status: DC

## 2015-11-07 MED ORDER — CHLORHEXIDINE GLUCONATE 0.12% ORAL RINSE (MEDLINE KIT)
15.0000 mL | Freq: Two times a day (BID) | OROMUCOSAL | Status: DC
  Administered 2015-11-08 – 2015-11-16 (×16): 15 mL via OROMUCOSAL
  Filled 2015-11-07 (×2): qty 15

## 2015-11-07 MED ORDER — DOXYCYCLINE HYCLATE 100 MG IV SOLR
100.0000 mg | Freq: Two times a day (BID) | INTRAVENOUS | Status: DC
  Administered 2015-11-07 – 2015-11-10 (×6): 100 mg via INTRAVENOUS
  Filled 2015-11-07 (×7): qty 100

## 2015-11-07 MED ORDER — KETAMINE HCL 10 MG/ML IJ SOLN
INTRAMUSCULAR | Status: DC | PRN
  Administered 2015-11-07: 48 mg via INTRAVENOUS

## 2015-11-07 MED ORDER — LEVOFLOXACIN IN D5W 750 MG/150ML IV SOLN: 750.0000 mg | INTRAVENOUS | Status: DC

## 2015-11-07 MED ORDER — ASPIRIN 300 MG RE SUPP: 300.0000 mg | RECTAL | Status: AC

## 2015-11-07 MED ORDER — KETAMINE HCL 10 MG/ML IJ SOLN
INTRAMUSCULAR | Status: DC | PRN
  Administered 2015-11-07: 50 mg via INTRAVENOUS

## 2015-11-07 MED ORDER — PIPERACILLIN-TAZOBACTAM 3.375 G IVPB: 3.3750 g | Freq: Three times a day (TID) | INTRAVENOUS | Status: DC

## 2015-11-07 MED ORDER — FAMOTIDINE IN NACL 20-0.9 MG/50ML-% IV SOLN
20.0000 mg | Freq: Two times a day (BID) | INTRAVENOUS | Status: DC
  Administered 2015-11-07 – 2015-11-11 (×8): 20 mg via INTRAVENOUS
  Filled 2015-11-07 (×10): qty 50

## 2015-11-07 MED ORDER — ROCURONIUM BROMIDE 50 MG/5ML IV SOLN
INTRAVENOUS | Status: AC
  Filled 2015-11-07: qty 2

## 2015-11-07 MED ORDER — FENTANYL CITRATE (PF) 100 MCG/2ML IJ SOLN
25.0000 ug | INTRAMUSCULAR | Status: DC | PRN
  Administered 2015-11-09: 50 ug via INTRAVENOUS
  Filled 2015-11-07: qty 2

## 2015-11-07 MED ORDER — KETAMINE HCL 10 MG/ML IJ SOLN
1.0000 mg/kg | Freq: Once | INTRAMUSCULAR | Status: AC
  Filled 2015-11-07: qty 4.8

## 2015-11-07 MED ORDER — LEVOFLOXACIN 750 MG PO TABS: 750.0000 mg | ORAL_TABLET | Freq: Every day | ORAL | Status: DC

## 2015-11-07 MED ORDER — VANCOMYCIN HCL IN DEXTROSE 1-5 GM/200ML-% IV SOLN
1000.0000 mg | Freq: Once | INTRAVENOUS | Status: AC
  Administered 2015-11-07: 1000 mg via INTRAVENOUS
  Filled 2015-11-07: qty 200

## 2015-11-07 MED ORDER — ROCURONIUM BROMIDE 50 MG/5ML IV SOLN
INTRAVENOUS | Status: DC | PRN
  Administered 2015-11-07: 100 mg via INTRAVENOUS

## 2015-11-07 MED ORDER — PIPERACILLIN-TAZOBACTAM 3.375 G IVPB 30 MIN
3.3750 g | Freq: Once | INTRAVENOUS | Status: AC
  Administered 2015-11-07: 3.375 g via INTRAVENOUS
  Filled 2015-11-07: qty 50

## 2015-11-07 MED ORDER — SUCCINYLCHOLINE CHLORIDE 20 MG/ML IJ SOLN
INTRAMUSCULAR | Status: AC
  Filled 2015-11-07: qty 1

## 2015-11-07 MED ORDER — SODIUM CHLORIDE 0.9 % IV SOLN
INTRAVENOUS | Status: DC
  Administered 2015-11-07 – 2015-11-08 (×2): via INTRAVENOUS

## 2015-11-07 MED ORDER — PIPERACILLIN-TAZOBACTAM 3.375 G IVPB
3.3750 g | Freq: Three times a day (TID) | INTRAVENOUS | Status: DC
Start: 2015-11-08 — End: 2015-11-10
  Administered 2015-11-08 – 2015-11-10 (×8): 3.375 g via INTRAVENOUS
  Filled 2015-11-07 (×9): qty 50

## 2015-11-07 MED ORDER — SODIUM CHLORIDE 0.9 % IV BOLUS (SEPSIS)
1000.0000 mL | Freq: Once | INTRAVENOUS | Status: AC
  Administered 2015-11-07: 1000 mL via INTRAVENOUS

## 2015-11-07 MED ORDER — PROPOFOL 1000 MG/100ML IV EMUL
5.0000 ug/kg/min | INTRAVENOUS | Status: DC
  Administered 2015-11-07: 10 ug/kg/min via INTRAVENOUS
  Filled 2015-11-07: qty 100

## 2015-11-07 NOTE — ED Notes (Signed)
Successful intubation completed at this time by Dr. Sibyl Parrhapman.

## 2015-11-07 NOTE — Code Documentation (Signed)
Difficult airway cart at bedside.  Dr Sibyl Parrhapman preparing to intubate due to patient being unable to protect airway.

## 2015-11-07 NOTE — ED Provider Notes (Signed)
Arrival Date & Time: 11/07/15 & 1704 History   Chief Complaint  Patient presents with  . Altered Mental Status   HPI  A full HPI, Past Medical/Surgical, Family, and Social history and review of systems could not be completed due to limitations of mental status, acuity of illness and respiratory distress. Therefore the E&M emergency caveat is invoked. I made several attempts to obtain HPI from other sources. The limited supplemental history I obtained from family, EMS and PCP was used in my medical decision making.  Shawna BusmanJo Ann Torres is a 81 y.o. female who presents for AMS and witnessed to have hypoxia with rales in lungs. Given nebulizer of albuterol and [place] 3L and solumedrol 125 and zofran for emesis.   Past Medical History  I reviewed & agree with nursing's documentation of PMHx, PSHx, SHx & FHx. Past Medical History  Diagnosis Date  . Parkinson disease   . Dementia    No past surgical history on file. Social History   Social History  . Marital Status: Widowed    Spouse Name: N/A  . Number of Children: N/A  . Years of Education: N/A   Social History Main Topics  . Smoking status: Never Smoker   . Smokeless tobacco: Not on file  . Alcohol Use: No  . Drug Use: Not on file  . Sexual Activity: Not on file   Other Topics Concern  . Not on file   Social History Narrative   No family history on file.  Review of Systems  ROS limited as stated above in HPI.   Allergies  Review of patient's allergies indicates no known allergies.  Home Medications   Prior to Admission medications   Medication Sig Start Date End Date Taking? Authorizing Provider  amLODipine (NORVASC) 5 MG tablet Take 1 tablet (5 mg total) by mouth daily. 07/27/14   Ripudeep Jenna LuoK Rai, MD  azithromycin (ZITHROMAX) 200 MG/5ML suspension Take 12.5 mLs (500 mg total) by mouth daily. 07/12/15   Tomasita CrumbleAdeleke Oni, MD  carbidopa-levodopa (PARCOPA) 25-100 MG per disintegrating tablet Take 1-2 tablets by mouth 2 (two) times daily.  Takes 1 tablet in the am and 2 tablets in the pm.    Historical Provider, MD  feeding supplement, ENSURE COMPLETE, (ENSURE COMPLETE) LIQD Take 237 mLs by mouth 2 (two) times daily between meals. 07/27/14   Ripudeep Jenna LuoK Rai, MD  folic acid (FOLVITE) 400 MCG tablet Take 400 mcg by mouth daily.    Historical Provider, MD  Glycerin, Adult, 2.1 G SUPP Place 1 suppository rectally as needed for moderate constipation or severe constipation. 07/27/14   Ripudeep Jenna LuoK Rai, MD  Multiple Vitamin (MULTIVITAMIN WITH MINERALS) TABS Take 2 tablets by mouth daily.    Historical Provider, MD  omeprazole (PRILOSEC) 20 MG capsule Take 20 mg by mouth daily.    Historical Provider, MD  ondansetron (ZOFRAN ODT) 4 MG disintegrating tablet 4mg  ODT q4 hours prn nausea/vomit 07/12/15   Mirian MoMatthew Gentry, MD  polyethylene glycol [facility](MIRALAX / GLYCOLAX) packet Take 17 g by mouth 2 (two) times daily. 07/27/14   Ripudeep Jenna LuoK Rai, MD  senna-docusate (SENOKOT-S) 8.6-50 MG per tablet Take 1 tablet by mouth at bedtime. 07/27/14   Ripudeep Jenna LuoK Rai, MD  vitamin B-12 (CYANOCOBALAMIN) 1000 MCG tablet Take 1,000 mcg by mouth daily.    Historical Provider, MD  vitamin E 400 UNIT capsule Take 400 Units by mouth daily.    Historical Provider, MD    Physical Exam  BP 150/68 mmHg  Pulse 75  Resp 18  SpO2 98% Physical Exam Vitals & Nursing notes reviewed. CONST: female, obtunded and minimally responsive. Appears stated age. HEAD: [place]/AT. EYES: Pupils unequal and sluggish to light. No conjunctival injection & lid on left with swelling. ENMT: External nose & ears atraumatic.  Oropharynx w/o swelling or exudates but with emesis residue. NECK: Supple, w/o meningismus. Trachea midline. JVD & stridor absent. CVS: S1/S2 audible w/o gallops or obvious murmurs.  Peripheral pulses faint but equal in all extremities. Cap refill < 2 seconds. RESP: Respiratory effort abnormal. Coarsely bilaterally and w/ wheeze. GI: ND, soft. BS audible. MSK: Extremities w/o ttp,  contractures of LE. Joints w/o swelling. SKIN: Warm & dry. No rash. NEURO: CN II-XIII grossly intact. Sensation & Strength w/o deficit. W/o clonus. PSYCH: Mood & affect appropriate. Cooperative.  ED Course  Procedures  Labs Review Labs Reviewed  CBC WITH DIFFERENTIAL/PLATELET  COMPREHENSIVE METABOLIC PANEL  URINALYSIS, ROUTINE W REFLEX MICROSCOPIC (NOT AT [facility])  I-STAT TROPOININ, ED  I-STAT CHEM 8, ED  CBG MONITORING, ED    Imaging Review No results found.  Laboratory and Imaging results were personally reviewed by myself and used in the medical decision making of this patient's treatment and disposition.  EKG Interpretation  EKG Interpretation  Date/Time:    Ventricular Rate:    PR Interval:    QRS Duration:   QT Interval:    QTC Calculation:   R Axis:     Text Interpretation:        MDM  Shawna Torres is a 81 y.o. female with H&P as above. ED clinical course as follows:  Upon arrival concern for aspiration PNA given recent emesis and increased hypoxia therefore intubation performed for airway protection in light of no mental status improvement on supplemental oxygen. Patient hypothermic.  CXR with concerning findings likely aspiration PNA and ETT in place.  CT Head w/o reveals NAICA. ECG without STEMI and with known LBBB. Negative troponin.  Lactate elevated therefore due to concerns for sepsis. IVF and IV abx Vanc Zosyn and Levaquin given after cultures. Without evidence of shock, does not require pressors. Hgb stable.   Due to concerns for sepsis and respiratory failure, I consulted the Critical care service and discussed the patient's complete H&P along with their ED clinical course to now. Specifically discussed the patient's imaging and labs. Based upon that discussion, they state the patient requires admission to ICU.   Disposition: admit  Clinical Impression: No diagnosis found. Patient care discussed with Dr. Adela Lank, who oversaw their evaluation & treatment  & voiced agreement. House Officer: Jonette Eva, MD, Emergency Medicine.  Jonette Eva, MD 11/12/15 0216  Melene Plan, DO 11/12/15 1346

## 2015-11-07 NOTE — Progress Notes (Signed)
ANTIBIOTIC CONSULT NOTE - INITIAL  Pharmacy Consult for Vancomycin and Zosyn Indication: rule out sepsis  No Known Allergies  Patient Measurements: Height: 5\' 2"  (157.5 cm) Weight: 105 lb (47.628 kg) IBW/kg (Calculated) : 50.1 Adjusted Body Weight:   Vital Signs: Temp: 93 F (33.9 C) (11/11 2015) Temp Source: Rectal (11/11 1730) BP: 169/77 mmHg (11/11 2015) Pulse Rate: 69 (11/11 2015) Intake/Output from previous day:   Intake/Output from this shift: Total I/O In: 200 [I.V.:200] Out: 175 [Urine:175]  Labs:  Recent Labs  11/07/15 1715 11/07/15 1723  WBC 8.6  --   HGB 9.2* 11.6*  PLT 159  --   CREATININE 0.87 0.90   Estimated Creatinine Clearance: 36.8 mL/min (by C-G formula based on Cr of 0.9). No results for input(s): VANCOTROUGH, VANCOPEAK, VANCORANDOM, GENTTROUGH, GENTPEAK, GENTRANDOM, TOBRATROUGH, TOBRAPEAK, TOBRARND, AMIKACINPEAK, AMIKACINTROU, AMIKACIN in the last 72 hours.   Microbiology: No results found for this or any previous visit (from the past 720 hour(s)).  Medical History: Past Medical History  Diagnosis Date  . Parkinson disease   . Dementia     Medications:   (Not in a hospital admission) Scheduled:  . etomidate      . lidocaine (cardiac) 100 mg/745ml      . succinylcholine       Infusions:    Assessment: 81yo female presents with PCP with hypoxemia. Pharmacy is consulted to dose vancomycin and zosyn for suspected sepsis. Pt is hypothermic to 90.5, WBC 8.6, sCr 0.9, LA 2.93.  Pt received vancomycin 1g and zosyn 3.375g IV once in the ED.  Goal of Therapy:  Vancomycin trough level 15-20 mcg/ml  Plan:  Vancomycin 750mg  IV q24h Zosyn 3.375g IV q8h Measure antibiotic drug levels at steady state Follow up culture results, renal function and clinical course  Arlean HoppingCorey M. Newman PiesBall, PharmD Clinical Pharmacist Pager [phone] 11/07/2015,8:28 PM

## 2015-11-07 NOTE — H&P (Signed)
PULMONARY / CRITICAL CARE MEDICINE   Name: Shawna Torres MRN: [id number] DOB: 03-Mar-1932    ADMISSION DATE:  11/07/2015 CONSULTATION DATE:  11/07/15  REFERRING MD :  EDP  CHIEF COMPLAINT:  Respiratory failure/ sepsis  INITIAL PRESENTATION:  Shawna Torres is a 81 y.o. that presented to ED via EMS after she was found to be hypoxemic at her PCP's office.  She required emergent intubation once she arrived to ED after vomiting.  There was concern for aspiration.  Of note, patient hospitalized in 2012, at which time she was intubated.  PMH significant for Parkinson's disease, depression, GERD, HTN.  Upon my evaluation, patient saturating 100% on vent.  She was hypothermic to 93 F.  STUDIES:   CXR (11/11): Findings suggesting mild vascular congestion. Left base opacification likely small effusion with associated atelectasis although cannot exclude infection. Moderate stable elevation of the right hemidiaphragm. Tubes and lines as described. Note that the endotracheal tube has tip approximately 1.6 cm above the carina.  CT head (11/11):  No acute abnormalities.  Significant atrophy and small vessel disease.  SIGNIFICANT EVENTS: 11/11: Intubated, admitted to ICU   HISTORY OF PRESENT ILLNESS:   History is provided by daughter, Shawna Torres, and caregiver, Shawna Torres.  They report that patient developed a mild wheeze on Wednesday.  This morning wheeze slightly worsened.  They note chest congestion and a non productive cough.  She was brought to her PCP's, Dr Arlyce Dice, office for evaluation today, where she was noted to have a low O2 on pulse ox.  She was placed on nasal cannula in the office and EMS was called for transport to ED.  No fevers, chills, nausea, vomiting (until ED) or diarrhea.  Daughter notes that herself and another family member were sick recently.  Patient has no known h/o lung disease or cardiac disease.  No new medications.  Additionally, patient resides with her daughter.  She is completely  dependent on [facility] for care.  She at baseline mentates ok but does not perform her own ADLs.  She is wheelchair bound and incontinent at baseline.    PAST MEDICAL HISTORY :   has a past medical history of Parkinson disease and Dementia.  has no past surgical history on file. Prior to Admission medications   Medication Sig Start Date End Date Taking? Authorizing Provider  amLODipine (NORVASC) 5 MG tablet Take 1 tablet (5 mg total) by mouth daily. 07/27/14   Ripudeep Jenna Luo, MD  azithromycin (ZITHROMAX) 200 MG/5ML suspension Take 12.5 mLs (500 mg total) by mouth daily. 07/12/15   Tomasita Crumble, MD  carbidopa-levodopa (PARCOPA) 25-100 MG per disintegrating tablet Take 1-2 tablets by mouth 2 (two) times daily. Takes 1 tablet in the am and 2 tablets in the pm.    Historical Provider, MD  feeding supplement, ENSURE COMPLETE, (ENSURE COMPLETE) LIQD Take 237 mLs by mouth 2 (two) times daily between meals. 07/27/14   Ripudeep Jenna Luo, MD  folic acid (FOLVITE) 400 MCG tablet Take 400 mcg by mouth daily.    Historical Provider, MD  Glycerin, Adult, 2.1 G SUPP Place 1 suppository rectally as needed for moderate constipation or severe constipation. 07/27/14   Ripudeep Jenna Luo, MD  Multiple Vitamin (MULTIVITAMIN WITH MINERALS) TABS Take 2 tablets by mouth daily.    Historical Provider, MD  omeprazole (PRILOSEC) 20 MG capsule Take 20 mg by mouth daily.    Historical Provider, MD  ondansetron (ZOFRAN ODT) 4 MG disintegrating tablet  ODT q4 hours prn nausea/vomit 07/12/15  Mirian Mo, MD  polyethylene glycol [facility] Ethelene Hal) packet Take 17 g by mouth 2 (two) times daily. 07/27/14   Ripudeep Jenna Luo, MD  senna-docusate (SENOKOT-S) 8.6-50 MG per tablet Take 1 tablet by mouth at bedtime. 07/27/14   Ripudeep Jenna Luo, MD  vitamin B-12 (CYANOCOBALAMIN) 1000 MCG tablet Take 1,000 mcg by mouth daily.    Historical Provider, MD  vitamin E 400 UNIT capsule Take 400 Units by mouth daily.    Historical Provider, MD   No Known  Allergies  FAMILY HISTORY:  has no family status information on file.  SOCIAL HISTORY:  reports that she has never smoked. She does not have any smokeless tobacco history on file. She reports that she does not drink alcohol.  REVIEW OF SYSTEMS:  Patient sedated, all information Per HPI per family's report  SUBJECTIVE:   VITAL SIGNS: Temp:  [93.5 F (34.2 C)] 93.5 F (34.2 C) (11/11 1730) Pulse Rate:  [65-79] 78 (11/11 1920) Resp:  [7-22] 15 (11/11 1920) BP: (124-196)/(65-88) 176/83 mmHg (11/11 1920) SpO2:  [88 %-100 %] 100 % (11/11 1920) FiO2 (%):  [100 %] 100 % (11/11 1905) Weight:  [105 lb (47.628 kg)] 105 lb (47.628 kg) (11/11 1747) HEMODYNAMICS:   VENTILATOR SETTINGS: Vent Mode:  [-] PRVC FiO2 (%):  [100 %] 100 % Set Rate:  [15 bmp] 15 bmp Vt Set:  [400 mL] 400 mL PEEP:  [5 cmH20] 5 cmH20 Plateau Pressure:  [26 cmH20] 26 cmH20 INTAKE / OUTPUT:  Intake/Output Summary (Last 24 hours) at 11/07/15 1924 Last data filed at 11/07/15 1902  Gross per 24 hour  Intake    250 ml  Output      0 ml  Net    250 ml    PHYSICAL EXAMINATION: General:  Sedated, on full vent support Neuro:  Sedated, pupils extremely sluggish. L pupil ~2 mm, R pupil ~3.5 mm HEENT:  [place]/AT,  bruising near Left inner eye, mild swelling of lateral aspects of lips Cardiovascular:  RRR, no murmurs appreciated, no JVD appreciated Lungs:  Coarse breath sounds throughout, ETT in place saturating 100% on vent Abdomen:  Soft, ND, +BS Musculoskeletal:  LE with contractures, no edema, cool, LE pulses difficult to palpate Skin: bruising as above, no other lesions apparent  LABS:  CBC  Recent Labs Lab 11/07/15 1715 11/07/15 1723  WBC 8.6  --   HGB 9.2* 11.6*  HCT 30.0* 34.0*  PLT 159  --    Coag's No results for input(s): APTT, INR in the last 168 hours. BMET  Recent Labs Lab 11/07/15 1715 11/07/15 1723  NA 134* 132*  K 5.0 4.9  CL 99* 97*  CO2 26  --   BUN 28* 34*  CREATININE 0.87 0.90   GLUCOSE 211* 215*   Electrolytes  Recent Labs Lab 11/07/15 1715  CALCIUM 9.0   Sepsis Markers  Recent Labs Lab 11/07/15 1744  LATICACIDVEN 2.93*   ABG No results for input(s): PHART, PCO2ART, PO2ART in the last 168 hours. Liver Enzymes  Recent Labs Lab 11/07/15 1715  AST 48*  ALT 9*  ALKPHOS 92  BILITOT 0.3  ALBUMIN 3.4*   Cardiac Enzymes No results for input(s): TROPONINI, PROBNP in the last 168 hours. Glucose  Recent Labs Lab 11/07/15 1724  GLUCAP 183*    Imaging Ct Head Wo Contrast  11/07/2015  CLINICAL DATA:  Per family altered mental status and patient's mental status declined in transport. Pt vomited x 1 today. EXAM: CT HEAD WITHOUT CONTRAST  TECHNIQUE: Contiguous axial images were obtained from the base of the skull through the vertex without intravenous contrast. COMPARISON:  07/27/2014 FINDINGS: There is significant central and cortical atrophy. Severe periventricular white matter changes are consistent with small vessel disease and appear chronic. There is no intra or extra-axial fluid collection or mass lesion. The basilar cisterns and ventricles have a normal appearance. There is no CT evidence for acute infarction or hemorrhage. IMPRESSION: No evidence for acute intracranial abnormality. Significant atrophy and small vessel disease. Electronically Signed   By: Norva Pavlov M.D.   On: 11/07/2015 18:29   Dg Chest Portable 1 View  11/07/2015  CLINICAL DATA:  Endotracheal tube placement. EXAM: PORTABLE CHEST 1 VIEW COMPARISON:  11/07/2015 and 07/11/2015 FINDINGS: Patient is moderately rotated to the right. Endotracheal tube has tip approximately 1.6 cm above the carina along the anterior tracheal wall. Nasogastric tube courses into the region of the stomach and off the inferior portion of the film. There is moderate elevation right hemidiaphragm unchanged. Hazy opacification of the left base likely combination of small effusion with atelectasis. There is  prominence of the perihilar markings likely mild degree of vascular congestion. Remainder of the exam is unchanged. IMPRESSION: Findings suggesting mild vascular congestion. Left base opacification likely small effusion with associated atelectasis although cannot exclude infection. Moderate stable elevation of the right hemidiaphragm. Tubes and lines as described. Note that the endotracheal tube has tip approximately 1.6 cm above the carina. Electronically Signed   By: Elberta Fortis M.D.   On: 11/07/2015 19:19   Dg Chest Portable 1 View  11/07/2015  CLINICAL DATA:  Shortness of breath.  Concern for aspiration. EXAM: PORTABLE CHEST 1 VIEW COMPARISON:  07/11/2015 FINDINGS: There is opacity at the left lung base which obscures most of the left hemidiaphragm. This may be atelectasis. Pneumonia or aspiration pneumonitis is possible. There may be a small associated pleural effusion. Remainder of the lungs is clear. Right hemidiaphragm is elevated, stable from prior study. No pneumothorax. There cardiac silhouette is partly obscured. Is grossly normal in size. No mediastinal or hilar masses are evident. Bony thorax is demineralized. Intra medullary rod noted in the right humerus reducing an old, healed proximal shaft fracture. IMPRESSION: 1. Left lung base opacity which is consistent with pneumonia, aspiration pneumonitis, atelectasis or a combination. Possible associated small effusion. No other acute finding. Electronically Signed   By: Amie Portland M.D.   On: 11/07/2015 17:38     ASSESSMENT / PLAN:  PULMONARY OETT placed 11/11 A: Respiratory failure ? Aspiration pneumonia  Sepsis, Lactic acid 2.93 P:   - Admitted to ICU under Dr Celene Skeen - Full vent support - Sedation with intermittent Fentanyl - Antibiotics in ID section - Repeat CXR in am - ABG ordered, will also repeat in am - Trend lactic acid  - Continuous pulse ox - VS per floor protocol  CARDIOVASCULAR H/o hypertension  EKG 11/11:  sinus with LBBB, stable from previous Trop neg A:  BP 165/75. P:  - Not currently requiring pressors - Telemetry - IVFs per sepsis protocol - Will continue to monitor closely  RENAL A:   Mildly hyponatremic Foley Catheter placed 11/11  P:   - Monitor BMET - Replete electrolytes as needed - Mg, Phos ordered - Foley care  GASTROINTESTINAL A:   H/o GERD P:   - Stress dose Pepcid - NPO - Oral care  HEMATOLOGIC A:   No acute.  hgb 9.2. stable from 06/2015 P:  - Monitor CBC  INFECTIOUS A:   Sepsis ? Aspiration pneumonia  Currently hypothermic P:   BCx2 (11/11)>> UC (11/11)>> Aspirate cx (11/11)>> Legionella Ag (11/11)>> Urine Strep Ag (11/11) >> RVP (11/11)>>  Abx:   - Vanc 11/11>> - Zosyn 11/11>> - Levaquin 11/11>>  - Antibiotics per pharmacy. - Trend lactic acid - PCT protocol - Monitor temp.  - CBC in am  ENDOCRINE A:   No h/o endocrine d/o   P:   - Monitor CBGs - Avoid hypoglycemia  NEUROLOGIC A:   Parkinson's disease on Carbidopa/Levodopa at home P:   - RASS goal: -1 - Fentanyl for sedation - Restart parkinson's meds when able   FAMILY  - Updates:   - Inter-disciplinary family meet or Palliative Care meeting due by:  11/18   Ashly M. Nadine CountsGottschalk, DO PGY-2, [facility] 11/07/2015, 7:24 PM

## 2015-11-07 NOTE — ED Notes (Signed)
Per EMS, pt was taken by family to her MD for decreased LOC. When being assessed by her MD they found low SpO2 and heard rales throughout the lung fields. MD placed pt on 3L [place] which aroused her slightly. EMS arrived pt placed on neb treatment with 5mg  of albuterol. Pt vomited once and was given 4mg  of zofran and 125 of solu-medrol. Pt then given one duo neb treatment by EMS. Pts mental status declined in transport. Pt minimally responsive upon arrival to ED. MD at the bedside. HR initially 45, HR now 75. SpO2 98%.

## 2015-11-08 DIAGNOSIS — D6489 Other specified anemias: Secondary | ICD-10-CM | POA: Insufficient documentation

## 2015-11-08 DIAGNOSIS — G9340 Encephalopathy, unspecified: Secondary | ICD-10-CM

## 2015-11-08 LAB — URINE CULTURE

## 2015-11-08 LAB — PHOSPHORUS
PHOSPHORUS: 2.4 mg/dL — AB (ref 2.5–4.6)
Phosphorus: 2.6 mg/dL (ref 2.5–4.6)
Phosphorus: 2.9 mg/dL (ref 2.5–4.6)

## 2015-11-08 LAB — BLOOD GAS, ARTERIAL
ACID-BASE EXCESS: 2.7 mmol/L — AB (ref 0.0–2.0)
BICARBONATE: 26.5 meq/L — AB (ref 20.0–24.0)
DRAWN BY: 36496
FIO2: 0.5
O2 SAT: 99.1 %
PATIENT TEMPERATURE: 100
PCO2 ART: 40.7 mmHg (ref 35.0–45.0)
PEEP/CPAP: 5 cmH2O
PH ART: 7.433 (ref 7.350–7.450)
RATE: 15 resp/min
TCO2: 27.7 mmol/L (ref 0–100)
VT: 400 mL
pO2, Arterial: 136 mmHg — ABNORMAL HIGH (ref 80.0–100.0)

## 2015-11-08 LAB — BASIC METABOLIC PANEL
ANION GAP: 5 (ref 5–15)
BUN: 23 mg/dL — ABNORMAL HIGH (ref 6–20)
CALCIUM: 8 mg/dL — AB (ref 8.9–10.3)
CO2: 27 mmol/L (ref 22–32)
Chloride: 103 mmol/L (ref 101–111)
Creatinine, Ser: 0.93 mg/dL (ref 0.44–1.00)
GFR, EST NON AFRICAN AMERICAN: 56 mL/min — AB (ref >60)
GLUCOSE: 74 mg/dL (ref 65–99)
Potassium: 4.6 mmol/L (ref 3.5–5.1)
SODIUM: 135 mmol/L (ref 135–145)

## 2015-11-08 LAB — COMPREHENSIVE METABOLIC PANEL
ALT: 18 U/L (ref 14–54)
ANION GAP: 6 (ref 5–15)
AST: 56 U/L — ABNORMAL HIGH (ref 15–41)
Albumin: 2.9 g/dL — ABNORMAL LOW (ref 3.5–5.0)
Alkaline Phosphatase: 83 U/L (ref 38–126)
BUN: 24 mg/dL — ABNORMAL HIGH (ref 6–20)
CALCIUM: 8.4 mg/dL — AB (ref 8.9–10.3)
CHLORIDE: 100 mmol/L — AB (ref 101–111)
CO2: 28 mmol/L (ref 22–32)
Creatinine, Ser: 0.75 mg/dL (ref 0.44–1.00)
GFR calc non Af Amer: >60 mL/min (ref >60)
Glucose, Bld: 139 mg/dL — ABNORMAL HIGH (ref 65–99)
POTASSIUM: 5.2 mmol/L — AB (ref 3.5–5.1)
SODIUM: 134 mmol/L — AB (ref 135–145)
Total Bilirubin: 0.7 mg/dL (ref 0.3–1.2)
Total Protein: 5.4 g/dL — ABNORMAL LOW (ref 6.5–8.1)

## 2015-11-08 LAB — CBC WITH DIFFERENTIAL/PLATELET
BASOS PCT: 0 %
Basophils Absolute: 0 10*3/uL (ref 0.0–0.1)
EOS ABS: 0 10*3/uL (ref 0.0–0.7)
Eosinophils Relative: 0 %
HEMATOCRIT: 25.3 % — AB (ref 36.0–46.0)
HEMOGLOBIN: 7.9 g/dL — AB (ref 12.0–15.0)
LYMPHS ABS: 0.3 10*3/uL — AB (ref 0.7–4.0)
Lymphocytes Relative: 2 %
MCH: 24.5 pg — AB (ref 26.0–34.0)
MCHC: 31.2 g/dL (ref 30.0–36.0)
MCV: 78.3 fL (ref 78.0–100.0)
MONO ABS: 0.6 10*3/uL (ref 0.1–1.0)
MONOS PCT: 4 %
NEUTROS ABS: 16.5 10*3/uL — AB (ref 1.7–7.7)
Neutrophils Relative %: 95 %
Platelets: 162 10*3/uL (ref 150–400)
RBC: 3.23 MIL/uL — ABNORMAL LOW (ref 3.87–5.11)
RDW: 18.7 % — AB (ref 11.5–15.5)
WBC: 17.4 10*3/uL — ABNORMAL HIGH (ref 4.0–10.5)

## 2015-11-08 LAB — GLUCOSE, CAPILLARY
GLUCOSE-CAPILLARY: 53 mg/dL — AB (ref 65–99)
GLUCOSE-CAPILLARY: 59 mg/dL — AB (ref 65–99)
GLUCOSE-CAPILLARY: 133 mg/dL — AB (ref 65–99)
GLUCOSE-CAPILLARY: 107 mg/dL — AB (ref 65–99)
Glucose-Capillary: 122 mg/dL — ABNORMAL HIGH (ref 65–99)
Glucose-Capillary: 96 mg/dL (ref 65–99)

## 2015-11-08 LAB — MAGNESIUM
MAGNESIUM: 1.5 mg/dL — AB (ref 1.7–2.4)
Magnesium: 1.4 mg/dL — ABNORMAL LOW (ref 1.7–2.4)
Magnesium: 2.9 mg/dL — ABNORMAL HIGH (ref 1.7–2.4)

## 2015-11-08 LAB — TRIGLYCERIDES: TRIGLYCERIDES: 20 mg/dL (ref <150)

## 2015-11-08 LAB — PROCALCITONIN

## 2015-11-08 LAB — MRSA PCR SCREENING: MRSA BY PCR: INVALID — AB

## 2015-11-08 LAB — LACTIC ACID, PLASMA: Lactic Acid, Venous: 2.2 mmol/L (ref 0.5–2.0)

## 2015-11-08 MED ORDER — CARBIDOPA-LEVODOPA 25-100 MG PO TABS
1.0000 | ORAL_TABLET | Freq: Two times a day (BID) | ORAL | Status: DC
  Administered 2015-11-08 – 2015-11-09 (×3): 1
  Filled 2015-11-08 (×11): qty 1

## 2015-11-08 MED ORDER — DEXTROSE 50 % IV SOLN
INTRAVENOUS | Status: AC
  Administered 2015-11-08: 25 mL via INTRAVENOUS
  Filled 2015-11-08: qty 50

## 2015-11-08 MED ORDER — VITAL AF 1.2 CAL PO LIQD
1000.0000 mL | ORAL | Status: DC
  Administered 2015-11-08: 1000 mL
  Filled 2015-11-08 (×3): qty 1000

## 2015-11-08 MED ORDER — PRO-STAT SUGAR FREE PO LIQD
30.0000 mL | Freq: Two times a day (BID) | ORAL | Status: DC
  Administered 2015-11-08 – 2015-11-09 (×3): 30 mL
  Filled 2015-11-08 (×4): qty 30

## 2015-11-08 MED ORDER — DEXTROSE 50 % IV SOLN
25.0000 mL | Freq: Once | INTRAVENOUS | Status: AC
  Administered 2015-11-08: 25 mL via INTRAVENOUS

## 2015-11-08 MED ORDER — VITAL HIGH PROTEIN PO LIQD
1000.0000 mL | ORAL | Status: DC
  Filled 2015-11-08 (×2): qty 1000

## 2015-11-08 MED ORDER — MAGNESIUM SULFATE 4 GM/100ML IV SOLN
4.0000 g | Freq: Once | INTRAVENOUS | Status: AC
  Administered 2015-11-08: 4 g via INTRAVENOUS
  Filled 2015-11-08: qty 100

## 2015-11-08 NOTE — Care Management Note (Addendum)
Case Management Note  Patient Details  Name: Shawna Torres MRN: [id number] Date of Birth: 12/19/1932  Subjective/Objective:   81 y.o. F seen in the ED with rectal T 93 degrees, and hypoxic @ office of PCP. Emergent Intubation and admitted to ICU. Aspiration Pneumonia. Hx Parkinson's Disease and Dementia. Lives in private residence with family.                  Action/Plan: Will continue to follow for discharge needs and disposition as she remains intubated at present.    Expected Discharge Date:                  Expected Discharge Plan:     In-House Referral:     Discharge planning Services  CM Consult  Post Acute Care Choice:    Choice offered to:     DME Arranged:    DME Agency:     HH Arranged:    HH Agency:     Status of Service:  In process, will continue to follow  Medicare Important Message Given:    Date Medicare IM Given:    Medicare IM give by:    Date Additional Medicare IM Given:    Additional Medicare Important Message give by:     If discussed at Long Length of Stay Meetings, dates discussed:    Additional Comments:  Yvone NeuCrutchfield, Helaman Mecca M, RN 11/08/2015, 10:04 AM

## 2015-11-08 NOTE — Progress Notes (Addendum)
Initial Nutrition Assessment  DOCUMENTATION CODES:  Not applicable  INTERVENTION:  Initiate TF via OGT with Vital Af 1.2 at 25 ml/h and Prostat 30 ml BID on day 1; on day 2 d/c Prostat, increase to goal rate of 45 ml/h (1080 ml per day) to provide 1373 kcals (77 kcal from prop), 81 gm protein, 876 ml free water daily.  NUTRITION DIAGNOSIS:  Inadequate oral intake related to inability to eat as evidenced by NPO status.  GOAL:  Patient will meet greater than or equal to 90% of their needs  MONITOR:  Vent status, Weight trends, TF tolerance, I & O's, Labs  REASON FOR ASSESSMENT:  Consult  Enteral/tube feeding initiation and management  ASSESSMENT:  81 y/o female PMHx Parkinsons, dementia who presented from PCP office after found to have low 02. In ED developed vomiting w/ concern for aspiration and hypoxia and subsequently intubated. CAP vs asp PNA.   Pt intubated w/ no family/friednsd to obtain hx from.  Patient is currently intubated on ventilator support MV: 6.2 L/min Temp (24hrs), Avg:96.3 F (35.7 C), Min:90.5 F (32.5 C), Max:101.7 F (38.7 C)  Propofol: 2.9 ml/hr provides 77 kcals daily  NFPE: WDL   Labs reviewed: Hyponatremic, Hyperkalemic, Hypochloremic, Hyperglycemic, hypoalbuminemia   Diet Order:  Diet NPO time specified  Skin:  Multiple Bruises  Last BM:  Unknown  Height:  Ht Readings from Last 1 Encounters:  11/07/15 5\' 2"  (1.575 m)   Weight:  Wt Readings from Last 1 Encounters:  11/08/15 108 lb 14.5 oz (49.4 kg)   Wt Readings from Last 10 Encounters:  11/08/15 108 lb 14.5 oz (49.4 kg)  07/27/14 105 lb 11.2 oz (47.945 kg)  Admit weight: 106 lbs (48.18 kg)  Ideal Body Weight:  50 kg  BMI:  Body mass index is 19.91 kg/(m^2).  Estimated Nutritional Needs:  Kcal:  1313 kcals Protein:  67-77 g Pro Fluid:  1.3 liters fluid  EDUCATION NEEDS:  No education needs identified at this time  Christophe Louisathan Torez Beauregard RD, LDN Nutrition Pager:  [phone] 11/08/2015 11:02 AM

## 2015-11-08 NOTE — Progress Notes (Signed)
PULMONARY / CRITICAL CARE MEDICINE   Name: Shawna Torres MRN: [id number] DOB: 1932-12-15    ADMISSION DATE:  11/07/2015 CONSULTATION DATE:  11/07/15  REFERRING MD :  EDP  CHIEF COMPLAINT:  Respiratory failure/ sepsis  INITIAL PRESENTATION:  Shawna Torres is a 81 y.o. that presented to ED via EMS after she was found to be hypoxemic at her PCP's office.  She required emergent intubation once she arrived to ED after vomiting.  There was concern for aspiration.  Of note, patient hospitalized in 2012, at which time she was intubated.  PMH significant for Parkinson's disease, depression, GERD, HTN.  Upon my evaluation, patient saturating 100% on vent.  She was hypothermic to 93 F.  STUDIES:   CXR (11/11): Findings suggesting mild vascular congestion. Left base opacification likely small effusion with associated atelectasis although cannot exclude infection. Moderate stable elevation of the right hemidiaphragm. Tubes and lines as described. Note that the endotracheal tube has tip approximately 1.6 cm above the carina.  CT head (11/11):  No acute abnormalities.  Significant atrophy and small vessel disease.  SIGNIFICANT EVENTS: 11/11: Intubated, admitted to ICU  SUBJECTIVE:   VITAL SIGNS: Temp:  [90.5 F (32.5 C)-101.7 F (38.7 C)] 100.6 F (38.1 C) (11/12 0900) Pulse Rate:  [65-79] 69 (11/12 0900) Resp:  [7-22] 14 (11/12 0800) BP: (118-196)/(51-92) 144/62 mmHg (11/12 0900) SpO2:  [88 %-100 %] 100 % (11/12 0900) FiO2 (%):  [40 %-100 %] 40 % (11/12 0815) Weight:  [47.628 kg (105 lb)-49.4 kg (108 lb 14.5 oz)] 49.4 kg (108 lb 14.5 oz) (11/12 0444) HEMODYNAMICS:   VENTILATOR SETTINGS: Vent Mode:  [-] PRVC FiO2 (%):  [40 %-100 %] 40 % Set Rate:  [15 bmp] 15 bmp Vt Set:  [400 mL] 400 mL PEEP:  [5 cmH20] 5 cmH20 Plateau Pressure:  [21 cmH20-26 cmH20] 21 cmH20 INTAKE / OUTPUT:  Intake/Output Summary (Last 24 hours) at 11/08/15 1010 Last data filed at 11/08/15 0900  Gross per 24 hour   Intake 1728.95 ml  Output   1285 ml  Net 443.95 ml    PHYSICAL EXAMINATION: General:  Sedated, on full vent support, no distress.  Neuro:  Sedated. No focal def. Contracted LEs HEENT:  [place]/AT,  bruising near Left inner eye, mild swelling of lateral aspects of lips Cardiovascular:  RRR, no murmurs appreciated, no JVD appreciated Lungs:  Coarse breath sounds throughout, no accessory muscle use  Abdomen:  Soft, ND, +BS Musculoskeletal:  LE with contractures, no edema, cool, LE pulses difficult to palpate Skin: bruising as above, no other lesions apparent  LABS:  CBC  Recent Labs Lab 11/07/15 1715 11/07/15 1723 11/08/15 0240  WBC 8.6  --  17.4*  HGB 9.2* 11.6* 7.9*  HCT 30.0* 34.0* 25.3*  PLT 159  --  162   Coag's No results for input(s): APTT, INR in the last 168 hours. BMET  Recent Labs Lab 11/07/15 1715 11/07/15 1723 11/07/15 2205 11/08/15 0240  NA 134* 132*  --  134*  K 5.0 4.9  --  5.2*  CL 99* 97*  --  100*  CO2 26  --   --  28  BUN 28* 34*  --  24*  CREATININE 0.87 0.90 0.72 0.75  GLUCOSE 211* 215*  --  139*   Electrolytes  Recent Labs Lab 11/07/15 1715 11/08/15 0240  CALCIUM 9.0 8.4*  MG  --  1.4*  PHOS  --  2.6   Sepsis Markers  Recent Labs Lab 11/07/15 1744  11/07/15 2205 11/07/15 2359 11/08/15 0240  LATICACIDVEN 2.93* 2.1* 2.2*  --   PROCALCITON  --  <0.10  --  <0.10   ABG  Recent Labs Lab 11/07/15 2233 11/08/15 0338  PHART 7.401 7.433  PCO2ART 46.0* 40.7  PO2ART 192.0* 136*   Liver Enzymes  Recent Labs Lab 11/07/15 1715 11/08/15 0240  AST 48* 56*  ALT 9* 18  ALKPHOS 92 83  BILITOT 0.3 0.7  ALBUMIN 3.4* 2.9*   Cardiac Enzymes No results for input(s): TROPONINI, PROBNP in the last 168 hours. Glucose  Recent Labs Lab 11/07/15 2139 11/07/15 2331 11/08/15 0330 11/08/15 0819 11/08/15 0822 11/08/15 0946  GLUCAP 177* 160* 122* 59* 53* 133*    Imaging Ct Head Wo Contrast  11/07/2015  CLINICAL DATA:  Per family  altered mental status and patient's mental status declined in transport. Pt vomited x 1 today. EXAM: CT HEAD WITHOUT CONTRAST TECHNIQUE: Contiguous axial images were obtained from the base of the skull through the vertex without intravenous contrast. COMPARISON:  07/27/2014 FINDINGS: There is significant central and cortical atrophy. Severe periventricular white matter changes are consistent with small vessel disease and appear chronic. There is no intra or extra-axial fluid collection or mass lesion. The basilar cisterns and ventricles have a normal appearance. There is no CT evidence for acute infarction or hemorrhage. IMPRESSION: No evidence for acute intracranial abnormality. Significant atrophy and small vessel disease. Electronically Signed   By: Norva PavlovElizabeth  Brown M.D.   On: 11/07/2015 18:29   Dg Chest Portable 1 View  11/07/2015  CLINICAL DATA:  Endotracheal tube placement. EXAM: PORTABLE CHEST 1 VIEW COMPARISON:  11/07/2015 and 07/11/2015 FINDINGS: Patient is moderately rotated to the right. Endotracheal tube has tip approximately 1.6 cm above the carina along the anterior tracheal wall. Nasogastric tube courses into the region of the stomach and off the inferior portion of the film. There is moderate elevation right hemidiaphragm unchanged. Hazy opacification of the left base likely combination of small effusion with atelectasis. There is prominence of the perihilar markings likely mild degree of vascular congestion. Remainder of the exam is unchanged. IMPRESSION: Findings suggesting mild vascular congestion. Left base opacification likely small effusion with associated atelectasis although cannot exclude infection. Moderate stable elevation of the right hemidiaphragm. Tubes and lines as described. Note that the endotracheal tube has tip approximately 1.6 cm above the carina. Electronically Signed   By: Elberta Fortisaniel  Boyle M.D.   On: 11/07/2015 19:19   Dg Chest Portable 1 View  11/07/2015  CLINICAL DATA:   Shortness of breath.  Concern for aspiration. EXAM: PORTABLE CHEST 1 VIEW COMPARISON:  07/11/2015 FINDINGS: There is opacity at the left lung base which obscures most of the left hemidiaphragm. This may be atelectasis. Pneumonia or aspiration pneumonitis is possible. There may be a small associated pleural effusion. Remainder of the lungs is clear. Right hemidiaphragm is elevated, stable from prior study. No pneumothorax. There cardiac silhouette is partly obscured. Is grossly normal in size. No mediastinal or hilar masses are evident. Bony thorax is demineralized. Intra medullary rod noted in the right humerus reducing an old, healed proximal shaft fracture. IMPRESSION: 1. Left lung base opacity which is consistent with pneumonia, aspiration pneumonitis, atelectasis or a combination. Possible associated small effusion. No other acute finding. Electronically Signed   By: Amie Portlandavid  Ormond M.D.   On: 11/07/2015 17:38  Decreased aeration bilaterally; elevated R HD (chronic)  ASSESSMENT / PLAN:  PULMONARY OETT placed 11/11 A: Respiratory failure ? Aspiration pneumonia  P:   -  Full vent support - Sedation with intermittent Fentanyl - Antibiotics in ID section - Repeat CXR in am - Continuous pulse ox  CARDIOVASCULAR H/o hypertension  EKG 11/11: sinus with LBBB, stable from previous Trop neg A:  P:  - Telemetry - IVFs per sepsis protocol - Will continue to monitor closely  RENAL A:   Mildly hyponatremic Hyperkalemia  Hypomagnesemia   P:   - Monitor BMET - Replete electrolytes as needed - replace Mg - Foley care  GASTROINTESTINAL A:   H/o GERD P:   - Stress dose Pepcid - NPO - Oral care  HEMATOLOGIC A:   hgb drift 9.2 to 7.9. No evidence of bleeding. May represent dilutional change  P:  - Monitor CBC - repeat cbc this afternoon; may need to change to SCDs  INFECTIOUS A:   Sepsis ? Aspiration pneumonia  Currently hypothermic P:   BCx2 (11/11)>> UC (11/11)>> Aspirate  cx (11/11)>> Legionella Ag (11/11)>> Urine Strep Ag (11/11) >> RVP (11/11)>>  Abx:   - Vanc 11/11>> - Zosyn 11/11>> - Levaquin 11/11>>  - PCT protocol - Monitor temp.  - CBC in am  ENDOCRINE A:   No h/o endocrine d/o   P:   - Monitor CBGs - Avoid hypoglycemia  NEUROLOGIC A:   Parkinson's disease on Carbidopa/Levodopa at home P:   - RASS goal: -1 - Fentanyl for sedation - Restart parkinson's meds vt    FAMILY  - Updates:   - Inter-disciplinary family meet or Palliative Care meeting due by:  11/18   Most likely aspiration vs CAP. Will lighten sedation, resume PD meds. Hope to wean soon. Cont current abx and wean as able. Began discussion w/ daughter about goals of care.   Simonne Martinet ACNP-BC [facility] Pulmonary/Critical Care Pager # [phone] OR # [phone] if no answer   Attending Note:  81 year old female with advance parkinsons that is wheel chair bound.  Intubated for aspiration and PNA.  On abx.  Sedation adequate.  O2 demand improving and hemodynamics are stable.  Coarse BS on exam.  I reviewed CXR myself ETT ok position.  Will continue full vent support for now.  KVO IVF.  Begin PS trials in AM.  Will need to discuss code status prior to extubation as trach/peg would not be an option for this patient.  The patient is critically ill with multiple organ systems failure and requires high complexity decision making for assessment and support, frequent evaluation and titration of therapies, application of advanced monitoring technologies and extensive interpretation of multiple databases.   Critical Care Time devoted to patient care services described in this note is  35  Minutes. This time reflects time of care of this signee Dr Koren Bound. This critical care time does not reflect procedure time, or teaching time or supervisory time of PA/NP/Med student/Med Resident etc but could involve care discussion time.  Alyson Reedy, M.D. [facility] Pulmonary/Critical  Care Medicine. Pager: [phone]. After hours pager: [phone].  11/08/2015, 10:10 AM

## 2015-11-09 ENCOUNTER — Inpatient Hospital Stay (HOSPITAL_COMMUNITY): Payer: Medicare Other

## 2015-11-09 LAB — CBC
HCT: 24.5 % — ABNORMAL LOW (ref 36.0–46.0)
HEMOGLOBIN: 7.6 g/dL — AB (ref 12.0–15.0)
MCH: 24.4 pg — AB (ref 26.0–34.0)
MCHC: 31 g/dL (ref 30.0–36.0)
MCV: 78.5 fL (ref 78.0–100.0)
PLATELETS: 137 10*3/uL — AB (ref 150–400)
RBC: 3.12 MIL/uL — ABNORMAL LOW (ref 3.87–5.11)
RDW: 19.1 % — AB (ref 11.5–15.5)
WBC: 7.8 10*3/uL (ref 4.0–10.5)

## 2015-11-09 LAB — COMPREHENSIVE METABOLIC PANEL
ALBUMIN: 2.6 g/dL — AB (ref 3.5–5.0)
ALK PHOS: 72 U/L (ref 38–126)
ALT: 7 U/L — AB (ref 14–54)
ANION GAP: 6 (ref 5–15)
AST: 40 U/L (ref 15–41)
BILIRUBIN TOTAL: 0.6 mg/dL (ref 0.3–1.2)
BUN: 27 mg/dL — ABNORMAL HIGH (ref 6–20)
CALCIUM: 8 mg/dL — AB (ref 8.9–10.3)
CO2: 28 mmol/L (ref 22–32)
Chloride: 103 mmol/L (ref 101–111)
Creatinine, Ser: 0.91 mg/dL (ref 0.44–1.00)
GFR calc Af Amer: >60 mL/min (ref >60)
GFR calc non Af Amer: 58 mL/min — ABNORMAL LOW (ref >60)
Glucose, Bld: 98 mg/dL (ref 65–99)
Potassium: 4.1 mmol/L (ref 3.5–5.1)
SODIUM: 137 mmol/L (ref 135–145)
Total Protein: 5.2 g/dL — ABNORMAL LOW (ref 6.5–8.1)

## 2015-11-09 LAB — BLOOD GAS, ARTERIAL
Acid-Base Excess: 2.7 mmol/L — ABNORMAL HIGH (ref 0.0–2.0)
BICARBONATE: 26.6 meq/L — AB (ref 20.0–24.0)
DRAWN BY: 36496
FIO2: 0.4
O2 SAT: 99.5 %
PEEP: 5 cmH2O
Patient temperature: 98.6
RATE: 15 resp/min
TCO2: 27.8 mmol/L (ref 0–100)
VT: 400 mL
pCO2 arterial: 40.2 mmHg (ref 35.0–45.0)
pH, Arterial: 7.436 (ref 7.350–7.450)
pO2, Arterial: 168 mmHg — ABNORMAL HIGH (ref 80.0–100.0)

## 2015-11-09 LAB — GLUCOSE, CAPILLARY
GLUCOSE-CAPILLARY: 102 mg/dL — AB (ref 65–99)
GLUCOSE-CAPILLARY: 103 mg/dL — AB (ref 65–99)
GLUCOSE-CAPILLARY: 109 mg/dL — AB (ref 65–99)
GLUCOSE-CAPILLARY: 114 mg/dL — AB (ref 65–99)
GLUCOSE-CAPILLARY: 98 mg/dL (ref 65–99)
Glucose-Capillary: 78 mg/dL (ref 65–99)
Glucose-Capillary: 104 mg/dL — ABNORMAL HIGH (ref 65–99)

## 2015-11-09 LAB — TRIGLYCERIDES: Triglycerides: 44 mg/dL (ref <150)

## 2015-11-09 LAB — PHOSPHORUS
PHOSPHORUS: 2.3 mg/dL — AB (ref 2.5–4.6)
Phosphorus: 4.5 mg/dL (ref 2.5–4.6)

## 2015-11-09 LAB — PROCALCITONIN: PROCALCITONIN: 0.14 ng/mL

## 2015-11-09 LAB — MAGNESIUM
MAGNESIUM: 2.3 mg/dL (ref 1.7–2.4)
Magnesium: 2.1 mg/dL (ref 1.7–2.4)

## 2015-11-09 MED ORDER — DEXTROSE 5 % IV SOLN
30.0000 mmol | Freq: Once | INTRAVENOUS | Status: AC
  Administered 2015-11-09: 30 mmol via INTRAVENOUS
  Filled 2015-11-09 (×2): qty 10

## 2015-11-09 MED ORDER — DEXTROSE IN LACTATED RINGERS 5 % IV SOLN
INTRAVENOUS | Status: DC
  Administered 2015-11-09 – 2015-11-11 (×3): via INTRAVENOUS
  Administered 2015-11-11: 60 mL/h via INTRAVENOUS

## 2015-11-09 MED ORDER — DEXTROSE IN LACTATED RINGERS 5 % IV SOLN: INTRAVENOUS | Status: DC

## 2015-11-09 NOTE — Progress Notes (Signed)
NP and RT notified about [name] M.D findings about endotracheal identified with tip at the carina with recommendation for 2-3 cm retraction.

## 2015-11-09 NOTE — Progress Notes (Signed)
PULMONARY / CRITICAL CARE MEDICINE   Name: Shawna Torres MRN: [id number] DOB: [birth year]    ADMISSION DATE:  11/07/2015 CONSULTATION DATE:  11/07/15  REFERRING MD :  EDP  CHIEF COMPLAINT:  Respiratory failure/ sepsis  INITIAL PRESENTATION:  Shawna Torres is a 81 y.o. that presented to ED via EMS after she was found to be hypoxemic at her PCP's office.  She required emergent intubation once she arrived to ED after vomiting.  There was concern for aspiration.  Of note, patient hospitalized in 2012, at which time she was intubated.  PMH significant for Parkinson's disease, depression, GERD, HTN.  Upon my evaluation, patient saturating 100% on vent.  She was hypothermic to 93 F.  STUDIES:   CXR (11/11): Findings suggesting mild vascular congestion. Left base opacification likely small effusion with associated atelectasis although cannot exclude infection. Moderate stable elevation of the right hemidiaphragm. Tubes and lines as described. Note that the endotracheal tube has tip approximately 1.6 cm above the carina.  CT head (11/11):  No acute abnormalities.  Significant atrophy and small vessel disease.  SIGNIFICANT EVENTS: 11/11: Intubated, admitted to ICU 11/12 decreased sedation 11/13 weaning efforts started.   SUBJECTIVE:   VITAL SIGNS: Temp:  [97.5 F (36.4 C)-100.4 F (38 C)] 97.7 F (36.5 C) (11/13 0800) Pulse Rate:  [52-71] 60 (11/13 0800) Resp:  [13-20] 17 (11/13 0800) BP: (123-176)/(57-85) 146/65 mmHg (11/13 0800) SpO2:  [98 %-100 %] 100 % (11/13 0800) FiO2 (%):  [30 %-40 %] 30 % (11/13 0801) Weight:  [47.9 kg (105 lb 9.6 oz)] 47.9 kg (105 lb 9.6 oz) (11/13 0500) HEMODYNAMICS:   VENTILATOR SETTINGS: Vent Mode:  [-] PSV;CPAP FiO2 (%):  [30 %-40 %] 30 % Set Rate:  [15 bmp] 15 bmp Vt Set:  [400 mL] 400 mL PEEP:  [5 cmH20] 5 cmH20 Pressure Support:  [5 cmH20] 5 cmH20 Plateau Pressure:  [18 cmH20-23 cmH20] 22 cmH20 INTAKE / OUTPUT:  Intake/Output Summary (Last 24  hours) at 11/09/15 0954 Last data filed at 11/09/15 0800  Gross per 24 hour  Intake 2461.08 ml  Output   2325 ml  Net 136.08 ml    PHYSICAL EXAMINATION: General:  Sedated, on full vent support, no distress. Now weaning w/ acceptable Vts  Neuro:  Slow to respond. No focal def. Contracted LEs HEENT:  [place]/AT,  bruising near Left inner eye, mild swelling of lateral aspects of lips Cardiovascular:  RRR, no murmurs appreciated, no JVD appreciated Lungs:  Coarse breath sounds throughout, no accessory muscle use, decreased right base.   Abdomen:  Soft, ND, +BS Musculoskeletal:  LE with contractures, no edema, cool, LE pulses difficult to palpate Skin: bruising as above, no other lesions apparent  LABS:  CBC  Recent Labs Lab 11/07/15 1715 11/07/15 1723 11/08/15 0240 11/09/15 0202  WBC 8.6  --  17.4* 7.8  HGB 9.2* 11.6* 7.9* 7.6*  HCT 30.0* 34.0* 25.3* 24.5*  PLT 159  --  162 137*   Coag's No results for input(s): APTT, INR in the last 168 hours. BMET  Recent Labs Lab 11/08/15 0240 11/08/15 1320 11/09/15 0202  NA 134* 135 137  K 5.2* 4.6 4.1  CL 100* 103 103  CO2 BUN 24* 23* 27*  CREATININE 0.75 0.93 0.91  GLUCOSE 139* 74 98   Electrolytes  Recent Labs Lab 11/08/15 0240 11/08/15 1320 11/08/15 2218 11/09/15 0202  CALCIUM 8.4* 8.0*  --  8.0*  MG 1.4* 1.5* 2.9*  --  PHOS 2.6 2.9 2.4*  --    Sepsis Markers  Recent Labs Lab 11/07/15 1744 11/07/15 2205 11/07/15 2359 11/08/15 0240 11/09/15 0202  LATICACIDVEN 2.93* 2.1* 2.2*  --   --   PROCALCITON  --  <0.10  --  <0.10 0.14   ABG  Recent Labs Lab 11/07/15 2233 11/08/15 0338 11/09/15 0445  PHART 7.401 7.433 7.436  PCO2ART 46.0* 40.7 40.2  PO2ART 192.0* 136* 168*   Liver Enzymes  Recent Labs Lab 11/07/15 1715 11/08/15 0240 11/09/15 0202  AST 48* 56* 40  ALT 9* 18 7*  ALKPHOS 92 83 72  BILITOT 0.3 0.7 0.6  ALBUMIN 3.4* 2.9* 2.6*   Cardiac Enzymes No results for input(s):  TROPONINI, PROBNP in the last 168 hours. Glucose  Recent Labs Lab 11/08/15 1220 11/08/15 1612 11/08/15 1950 11/08/15 2342 11/09/15 0409 11/09/15 0803  GLUCAP 107* 96 109* 114* 98 102*    Imaging Dg Chest Port 1 View  11/09/2015  CLINICAL DATA:  81 year old female with pneumonia. EXAM: PORTABLE CHEST 1 VIEW COMPARISON:  11/07/2015 and prior chest radiographs FINDINGS: Cardiomediastinal silhouette is unchanged. Decreased left lung pulmonary vascular congestion noted. An endotracheal tube is identified with tip at the carina -recommend 2-3 cm retraction. Elevation of the right hemidiaphragm and bibasilar atelectasis/airspace disease again noted. There is no evidence of pneumothorax. An NG tube is identified with tip overlying the pre-pyloric region. IMPRESSION: Endotracheal tube with tip at the carina -recommend 2-3 cm retraction. Delsa Saleyril, nurse with this patient, was notified. Decreased left sided pulmonary vascular congestion. No other significant changes. Electronically Signed   By: Harmon PierJeffrey  Hu M.D.   On: 11/09/2015 07:37  Decreased aeration bilaterally; elevated R HD (chronic)  ASSESSMENT / PLAN:  PULMONARY OETT placed 11/11 A: Respiratory failure ? Aspiration pneumonia  Chronically elevated Right HD > aeration a little improved P:   - Full vent support - daily SBT; hope to extubate. High risk for future decompensation  - Antibiotics in ID section - Continuous pulse ox  CARDIOVASCULAR H/o hypertension  EKG 11/11: sinus with LBBB, stable from previous Trop neg A:  P:  - Telemetry - IVFs KVO - Will continue to monitor closely  RENAL A:   Mildly hyponatremic-->resolved  Hypophosphatemia  Hypomagnesemia -->resolved   P:   - Monitor BMET - Replete electrolytes as needed - Foley care - replace PO4  GASTROINTESTINAL A:   H/o GERD P:   - Stress dose Pepcid - NPO - Oral care - tubefeeds   HEMATOLOGIC A:  Anemia of chronic disease.  No evidence of bleeding.  Leveled out in 7.6 range.  P:  - Monitor CBC -cont [place] heparin   INFECTIOUS A:   Sepsis ? Aspiration pneumonia  Currently hypothermic P:   BCx2 (11/11)>> UC (11/11)>> Aspirate cx (11/11)>>few gpc pairs/few gnr Legionella Ag (11/11)>> Urine Strep Ag (11/11) >>neg RVP (11/11)>>  Abx:   - Vanc 11/11>> - Zosyn 11/11>> - Levaquin 11/11>>  - PCT protocol - Monitor temp.  - CBC in am  ENDOCRINE A:   No h/o endocrine d/o   P:   - Monitor CBGs - Avoid hypoglycemia  NEUROLOGIC A:   Parkinson's disease on Carbidopa/Levodopa at home P:   - RASS goal: -1 - Fentanyl for sedation-->PRN only and d/c if extubated - Restarted parkinson's meds vt    FAMILY  - Updates:   - Inter-disciplinary family meet or Palliative Care meeting due by:  11/18   Most likely aspiration vs CAP. Improved some and now  weaning.  Cont current abx and wean as able. Began discussion w/ daughter about goals of care. Will revisit this prior to extubation. May be able to come off vent today.   Simonne Martinet ACNP-BC [facility] Pulmonary/Critical Care Pager # [phone] OR # [phone] if no answer   Attending Note:  81 year old female with advance parkinsons that is wheel chair bound. Intubated for aspiration and PNA. On abx. Sedation adequate. O2 demand improving and hemodynamics are stable. Coarse BS on exam. I reviewed CXR myself ETT ok position. Will extubate today.  Remains full code for now.  Will continue to discuss code status for now.  If reintubated then will likely need a tracheostomy.  The patient is critically ill with multiple organ systems failure and requires high complexity decision making for assessment and support, frequent evaluation and titration of therapies, application of advanced monitoring technologies and extensive interpretation of multiple databases.   Critical Care Time devoted to patient care services described in this note is 35 Minutes. This time reflects time of  care of this signee Dr Koren Bound. This critical care time does not reflect procedure time, or teaching time or supervisory time of PA/NP/Med student/Med Resident etc but could involve care discussion time.  Alyson Reedy, M.D. [facility] Pulmonary/Critical Care Medicine. Pager: [phone]. After hours pager: [phone].  11/09/2015, 9:54 AM

## 2015-11-09 NOTE — Progress Notes (Signed)
RT note-ETT withdrawn 2cm as recommended by xray.

## 2015-11-09 NOTE — Procedures (Signed)
Extubation Procedure Note  Patient Details:   Name: Shawna Torres DOB: 08/26/1932 MRN: [id number]   Airway Documentation:     Evaluation  O2 sats: stable throughout Complications: No apparent complications Patient did tolerate procedure well. Bilateral Breath Sounds: Rhonchi Suctioning: Oral, Airway Yes  4l/min    Shawna Torres, Shawna Torres 11/09/2015, 12:22 PM

## 2015-11-10 LAB — GLUCOSE, CAPILLARY
GLUCOSE-CAPILLARY: 103 mg/dL — AB (ref 65–99)
GLUCOSE-CAPILLARY: 129 mg/dL — AB (ref 65–99)
Glucose-Capillary: 93 mg/dL (ref 65–99)
Glucose-Capillary: 81 mg/dL (ref 65–99)
Glucose-Capillary: 92 mg/dL (ref 65–99)
Glucose-Capillary: 97 mg/dL (ref 65–99)

## 2015-11-10 LAB — CBC
HCT: 26 % — ABNORMAL LOW (ref 36.0–46.0)
Hemoglobin: 8 g/dL — ABNORMAL LOW (ref 12.0–15.0)
MCH: 24.5 pg — AB (ref 26.0–34.0)
MCHC: 30.8 g/dL (ref 30.0–36.0)
MCV: 79.8 fL (ref 78.0–100.0)
PLATELETS: 140 10*3/uL — AB (ref 150–400)
RBC: 3.26 MIL/uL — ABNORMAL LOW (ref 3.87–5.11)
RDW: 19.3 % — AB (ref 11.5–15.5)
WBC: 7.4 10*3/uL (ref 4.0–10.5)

## 2015-11-10 LAB — COMPREHENSIVE METABOLIC PANEL
ALT: 19 U/L (ref 14–54)
AST: 30 U/L (ref 15–41)
Albumin: 2.7 g/dL — ABNORMAL LOW (ref 3.5–5.0)
Alkaline Phosphatase: 70 U/L (ref 38–126)
Anion gap: 5 (ref 5–15)
BILIRUBIN TOTAL: 0.5 mg/dL (ref 0.3–1.2)
BUN: 24 mg/dL — ABNORMAL HIGH (ref 6–20)
CHLORIDE: 106 mmol/L (ref 101–111)
CO2: 29 mmol/L (ref 22–32)
CREATININE: 0.78 mg/dL (ref 0.44–1.00)
Calcium: 8.2 mg/dL — ABNORMAL LOW (ref 8.9–10.3)
Glucose, Bld: 105 mg/dL — ABNORMAL HIGH (ref 65–99)
POTASSIUM: 4.1 mmol/L (ref 3.5–5.1)
Sodium: 140 mmol/L (ref 135–145)
Total Protein: 5.5 g/dL — ABNORMAL LOW (ref 6.5–8.1)

## 2015-11-10 LAB — CULTURE, RESPIRATORY W GRAM STAIN

## 2015-11-10 LAB — TRIGLYCERIDES: Triglycerides: 34 mg/dL (ref <150)

## 2015-11-10 LAB — MRSA CULTURE

## 2015-11-10 LAB — CULTURE, RESPIRATORY

## 2015-11-10 LAB — LEGIONELLA PNEUMOPHILA SEROGP 1 UR AG: L. PNEUMOPHILA SEROGP 1 UR AG: NEGATIVE

## 2015-11-10 LAB — MAGNESIUM
MAGNESIUM: 1.9 mg/dL (ref 1.7–2.4)
Magnesium: 1.8 mg/dL (ref 1.7–2.4)

## 2015-11-10 LAB — PHOSPHORUS
PHOSPHORUS: 3.3 mg/dL (ref 2.5–4.6)
PHOSPHORUS: 2.8 mg/dL (ref 2.5–4.6)

## 2015-11-10 MED ORDER — SODIUM CHLORIDE 0.9 % IV SOLN
3.0000 g | Freq: Four times a day (QID) | INTRAVENOUS | Status: AC
  Administered 2015-11-10 – 2015-11-13 (×14): 3 g via INTRAVENOUS
  Filled 2015-11-10 (×15): qty 3

## 2015-11-10 NOTE — Care Management Note (Signed)
Case Management Note  Patient Details  Name: Shawna Torres MRN: [id number] Date of Birth: 01/11/1932  Subjective/Objective:  Pt continues to progress, Extubated 11/09/2015 and will have Diet as tolerated today. BC/UC pending from 11/07/15.        Action/Plan:CM will continue to follow for Discharge/Disposition needs.    Expected Discharge Date:                  Expected Discharge Plan:     In-House Referral:     Discharge planning Services  CM Consult  Post Acute Care Choice:    Choice offered to:     DME Arranged:    DME Agency:     HH Arranged:    HH Agency:     Status of Service:  In process, will continue to follow  Medicare Important Message Given:    Date Medicare IM Given:    Medicare IM give by:    Date Additional Medicare IM Given:    Additional Medicare Important Message give by:     If discussed at Long Length of Stay Meetings, dates discussed:    Additional Comments:  Shawna Torres, Shawna [place] M, RN 11/10/2015, 9:54 AM

## 2015-11-10 NOTE — Progress Notes (Signed)
PULMONARY / CRITICAL CARE MEDICINE   Name: Shawna Torres MRN: [id number] DOB: 05/26/1932    ADMISSION DATE:  11/07/2015 CONSULTATION DATE:  11/07/15  REFERRING MD :  EDP  CHIEF COMPLAINT:  Respiratory failure/ sepsis  INITIAL PRESENTATION:  Shawna Torres is a 81 y.o. that presented to ED via EMS after she was found to be hypoxemic at her PCP's office.  She required emergent intubation once she arrived to ED after vomiting.  There was concern for aspiration.  Of note, patient hospitalized in 2012, at which time she was intubated.  PMH significant for Parkinson's disease, depression, GERD, HTN.  Upon my evaluation, patient saturating 100% on vent.  She was hypothermic to 93 F.  STUDIES:   CXR (11/11): Findings suggesting mild vascular congestion. Left base opacification likely small effusion with associated atelectasis although cannot exclude infection. Moderate stable elevation of the right hemidiaphragm. Tubes and lines as described. Note that the endotracheal tube has tip approximately 1.6 cm above the carina.  CT head (11/11):  No acute abnormalities.  Significant atrophy and small vessel disease.  SIGNIFICANT EVENTS: 11/11: Intubated, admitted to ICU 11/12 decreased sedation 11/13 Extubated  SUBJECTIVE:  Extubated, chronically ill appearing.  VITAL SIGNS: Temp:  [95.5 F (35.3 C)-98.4 F (36.9 C)] 96.3 F (35.7 C) (11/13 2300) Pulse Rate:  [53-71] 53 (11/13 2300) Resp:  [11-19] 18 (11/13 2300) BP: (146-190)/(65-88) 179/73 mmHg (11/13 2300) SpO2:  [99 %-100 %] 100 % (11/13 2300) FiO2 (%):  [30 %] 30 % (11/13 0801) HEMODYNAMICS:   VENTILATOR SETTINGS: Vent Mode:  [-] PSV;CPAP FiO2 (%):  [30 %] 30 % PEEP:  [5 cmH20] 5 cmH20 Pressure Support:  [5 cmH20] 5 cmH20 INTAKE / OUTPUT:  Intake/Output Summary (Last 24 hours) at 11/10/15 0716 Last data filed at 11/10/15 0100  Gross per 24 hour  Intake   1849 ml  Output   1850 ml  Net     -1 ml    PHYSICAL  EXAMINATION: General:  NAD, in bed Neuro:  Awakens to voices, does not track, does not speak, does not follow commands.  Contracted LEs HEENT:  [place]/AT, increased oral secretions Cardiovascular:  RRR, no murmurs appreciated, no JVD appreciated Lungs:  Coarse breath sounds throughout, mild accessory muscle use, decreased right lung fields.  Abdomen:  Soft, ND, +BS Musculoskeletal:  LE with contractures, no edema, cool, LE pulses difficult to palpate Skin: bruising as above, no other lesions apparent  LABS:  CBC  Recent Labs Lab 11/08/15 0240 11/09/15 0202 11/10/15 0211  WBC 17.4* 7.8 7.4  HGB 7.9* 7.6* 8.0*  HCT 25.3* 24.5* 26.0*  PLT 162 137* 140*   Coag's No results for input(s): APTT, INR in the last 168 hours. BMET  Recent Labs Lab 11/08/15 1320 11/09/15 0202 11/10/15 0211  NA 135 137 140  K 4.6 4.1 4.1  CL 103 103 106  CO2 27 28 29   BUN 23* 27* 24*  CREATININE 0.93 0.91 0.78  GLUCOSE 74 98 105*   Electrolytes  Recent Labs Lab 11/08/15 1320 11/08/15 2218 11/09/15 0202 11/09/15 1050 11/09/15 2243 11/10/15 0211  CALCIUM 8.0*  --  8.0*  --   --  8.2*  MG 1.5* 2.9*  --  2.3 2.1  --   PHOS 2.9 2.4*  --  2.3* 4.5  --    Sepsis Markers  Recent Labs Lab 11/07/15 1744 11/07/15 2205 11/07/15 2359 11/08/15 0240 11/09/15 0202  LATICACIDVEN 2.93* 2.1* 2.2*  --   --  PROCALCITON  --  <0.10  --  <0.10 0.14   ABG  Recent Labs Lab 11/07/15 2233 11/08/15 0338 11/09/15 0445  PHART 7.401 7.433 7.436  PCO2ART 46.0* 40.7 40.2  PO2ART 192.0* 136* 168*   Liver Enzymes  Recent Labs Lab 11/08/15 0240 11/09/15 0202 11/10/15 0211  AST 56* 40 30  ALT 18 7* 19  ALKPHOS 83 72 70  BILITOT 0.7 0.6 0.5  ALBUMIN 2.9* 2.6* 2.7*   Cardiac Enzymes No results for input(s): TROPONINI, PROBNP in the last 168 hours. Glucose  Recent Labs Lab 11/09/15 0803 11/09/15 1231 11/09/15 1557 11/09/15 1936 11/09/15 2344 11/10/15 0356  GLUCAP 102* 78 104* 103* 129*  93    Imaging No results found.Decreased aeration bilaterally; elevated R HD (chronic)  ASSESSMENT / PLAN:  PULMONARY OETT placed 11/11>>11/13 A: Respiratory failure ? Aspiration pneumonia  Chronically elevated Right HD > aeration a little improved P:   - Antibiotics in ID section - Continuous pulse ox  CARDIOVASCULAR H/o hypertension  EKG 11/11: sinus with LBBB, stable from previous Trop neg A:  P:  - Telemetry - IVFs KVO - Will continue to monitor closely  RENAL A:   Mildly hyponatremic-->resolved  Hypophosphatemia , resolved Hypomagnesemia -->resolved   P:   - Monitor BMET - Replete electrolytes as needed - Foley care  GASTROINTESTINAL A:   H/o GERD P:   - Stress dose Pepcid - c/s SLP to evaluate, Start diet as tolerated today   HEMATOLOGIC A:  Anemia of chronic disease.  No evidence of bleeding. Hgb Stable P:  - Monitor CBC - Cont [place] heparin   INFECTIOUS A:   Sepsis ? Aspiration pneumonia  Hypothermia resolved P:   BCx2 (11/11)>> NG x2d UC (11/11)>> insignificant growth <2k colonies Aspirate cx (11/11)>>few gpc pairs/few gnr Legionella Ag (11/11)>>neg Urine Strep Ag (11/11) >>neg RVP (11/11)>>  Abx:   - Vanc 11/11>>11/14 - Zosyn 11/11>>11/14 - Levaquin 11/11>>11/14 - Unasyn 11/14>>>  - PCT protocol noted - Monitor temp.  - CBC in am  ENDOCRINE A:   No h/o endocrine d/o   P:   - Monitor CBGs - Avoid hypoglycemia  NEUROLOGIC A:   Parkinson's disease on Carbidopa/Levodopa at home P:   - RASS goal: -1 - Fentanyl for sedation-->PRN only and d/c if extubated - Restarted parkinson's meds vt    FAMILY  - Updates: 11/13 with daughter  - Inter-disciplinary family meet or Palliative Care meeting due by:  11/18  Ashly M. Nadine Counts, DO PGY-2, [facility]  Attending Note:  81 year old with advanced parkinsons unable to protect airway and with aspiration PNA.  Will change abx to unasyn.  Patient is actively  aspirating as we speak.  She can not protect her airway.  Daughter is on the way in, if tubed will need a trach.  I am concerned that trach/peg may be the wrong choice given her over all functional status.  Will monitor in ICU.  If daughter insists on full code status will tube today and hopefully trach soon.  In the meantime, change abx, d/c sedation and discuss with daughter.  The patient is critically ill with multiple organ systems failure and requires high complexity decision making for assessment and support, frequent evaluation and titration of therapies, application of advanced monitoring technologies and extensive interpretation of multiple databases.   Critical Care Time devoted to patient care services described in this note is  35  Minutes. This time reflects time of care of this signee Dr  Koren Bound. This critical care time does not reflect procedure time, or teaching time or supervisory time of PA/NP/Med student/Med Resident etc but could involve care discussion time.  Alyson Reedy, M.D. [facility] Pulmonary/Critical Care Medicine. Pager: [phone]. After hours pager: [phone].  11/10/2015, 7:16 AM

## 2015-11-10 NOTE — Progress Notes (Signed)
ANTIBIOTIC CONSULT NOTE - INITIAL  Pharmacy Consult for Unasyn Indication: aspiration PNA  Allergies  Allergen Reactions  . Depakote [Divalproex Sodium] Other (See Comments)    Thrombocytopenia    Patient Measurements: Height: 5\' 2"  (157.5 cm) Weight: 105 lb 9.6 oz (47.9 kg) IBW/kg (Calculated) : 50.1 Adjusted Body Weight:   Vital Signs: Temp: 98.2 F (36.8 C) (11/14 1300) Temp Source: Core (Comment) (11/14 1200) BP: 166/81 mmHg (11/14 1300) Pulse Rate: 68 (11/14 1300) Intake/Output from previous day: 11/13 0701 - 11/14 0700 In: 2269 [I.V.:1324; NG/GT:45; IV Piggyback:900] Out: 2100 [Urine:2100] Intake/Output from this shift: Total I/O In: 1070 [I.V.:420; IV Piggyback:650] Out: 285 [Urine:285]  Labs:  Recent Labs  11/08/15 0240 11/08/15 1320 11/09/15 0202 11/10/15 0211  WBC 17.4*  --  7.8 7.4  HGB 7.9*  --  7.6* 8.0*  PLT 162  --  137* 140*  CREATININE 0.75 0.93 0.91 0.78   Estimated Creatinine Clearance: 41.7 mL/min (by C-G formula based on Cr of 0.78). No results for input(s): VANCOTROUGH, VANCOPEAK, VANCORANDOM, GENTTROUGH, GENTPEAK, GENTRANDOM, TOBRATROUGH, TOBRAPEAK, TOBRARND, AMIKACINPEAK, AMIKACINTROU, AMIKACIN in the last 72 hours.   Microbiology: Recent Results (from the past 720 hour(s))  Blood Culture (routine x 2)     Status: None (Preliminary result)   Collection Time: 11/07/15  5:45 PM  Result Value Ref Range Status   Specimen Description BLOOD LEFT HAND  Final   Special Requests BOTTLES DRAWN AEROBIC AND ANAEROBIC 5CC  Final   Culture NO GROWTH 2 DAYS  Final   Report Status PENDING  Incomplete  Urine culture     Status: None   Collection Time: 11/07/15  5:56 PM  Result Value Ref Range Status   Specimen Description URINE, CATHETERIZED  Final   Special Requests NONE  Final   Culture 2,000 COLONIES/mL INSIGNIFICANT GROWTH  Final   Report Status 11/08/2015 FINAL  Final  MRSA PCR Screening     Status: Abnormal   Collection Time: 11/07/15   9:31 PM  Result Value Ref Range Status   MRSA by PCR INVALID RESULTS, SPECIMEN SENT FOR CULTURE (A) NEGATIVE Final    Comment:        The GeneXpert MRSA Assay (FDA approved for NASAL specimens only), is one component of a comprehensive MRSA colonization surveillance program. It is not intended to diagnose MRSA infection nor to guide or monitor treatment for MRSA infections. RESULT CALLED TO, READ BACK BY AND VERIFIED WITH: GREG @0113  11/08/15 MKELLY   MRSA culture     Status: None   Collection Time: 11/07/15  9:31 PM  Result Value Ref Range Status   Specimen Description NASAL SWAB  Final   Special Requests NONE  Final   Culture NOMRSA Performed at [facility]   Final   Report Status 11/10/2015 FINAL  Final  Culture, respiratory (NON-Expectorated)     Status: None   Collection Time: 11/07/15 10:38 PM  Result Value Ref Range Status   Specimen Description TRACHEAL ASPIRATE  Final   Special Requests NONE  Final   Gram Stain   Final    ABUNDANT WBC PRESENT,BOTH PMN AND MONONUCLEAR RARE SQUAMOUS EPITHELIAL CELLS PRESENT FEW GRAM POSITIVE COCCI IN PAIRS RARE GRAM NEGATIVE RODS Performed at [facility]    Culture   Final    Non-Pathogenic Oropharyngeal-type Flora Isolated. Performed at [facility]    Report Status 11/10/2015 FINAL  Final    Medical History: Past Medical History  Diagnosis Date  . Parkinson disease   .  Dementia     Medications:  Prescriptions prior to admission  Medication Sig Dispense Refill Last Dose  . escitalopram (LEXAPRO) 20 MG tablet Take 20 mg by mouth daily.   11/06/2015 at Unknown time  . carbidopa-levodopa (PARCOPA) 25-100 MG per disintegrating tablet Take 1-2 tablets by mouth 2 (two) times daily. Patients takes 2 tablets in the morning. 1 tablets in afternoon. 2 tablets in the evening,   11/07/2015 at 12p  . Multiple Vitamin (MULTIVITAMIN WITH MINERALS) TABS Take 2 tablets by mouth daily.   11/07/2015  .  omeprazole (PRILOSEC) 20 MG capsule Take 20 mg by mouth daily.   11/07/2015  . vitamin B-12 (CYANOCOBALAMIN) 1000 MCG tablet Take 1,000 mcg by mouth daily.   11/07/2015   Scheduled:  . ampicillin-sulbactam (UNASYN) IV  3 g Intravenous Q6H  . antiseptic oral rinse  7 mL Mouth Rinse QID  . carbidopa-levodopa  1 tablet Per Tube BID  . chlorhexidine gluconate  15 mL Mouth Rinse BID  . famotidine (PEPCID) IV  20 mg Intravenous Q12H  . heparin  5,000 Units Subcutaneous 3 times per day  . insulin aspart  0-9 Units Subcutaneous 6 times per day   Infusions:  . sodium chloride 10 mL/hr at 11/10/15 0700  . dextrose 5% lactated ringers 60 mL/hr at 11/10/15 0731    Assessment: 81yo female presents to PCP with hypoxemia. Vomited while in the ED with possible aspiration, then intubated in ED d/t inability to protect airway.  WBC down to 7.8>7.4. Afebrile. PCT 0.14. Vanc/zosyn/doxy narrowed to Unasyn for aspiration pneumonia.  11/14 unasyn >>  11/11 vanc>> 11/14  11/11 Zosyn>> 11/14  11/11 doxy>> 11/14   11/11 resp cx: oropharyngeal flora  11/11 resp virus>>  11/11 blood x2>> NGTD  11/11 urine>>neg  11/11 urine strep>>neg  11/11 urine legionella>>pend   Goal of Therapy:  Resolution of infection/clinical improvement  Plan:  Unasyn 3g IV q6h Monitor renal function, LOT, and clinical progression  Greggory Stallion, PharmD Clinical Pharmacy Resident Pager # [phone] 11/10/2015 1:26 PM

## 2015-11-10 NOTE — Evaluation (Signed)
Clinical/Bedside Swallow Evaluation Patient Details  Name: Shawna Torres MRN: [id number] Date of Birth: 04/03/1932  Today's Date: 11/10/2015 Time: SLP Start Time (ACUTE ONLY): 1125 SLP Stop Time (ACUTE ONLY): 1138 SLP Time Calculation (min) (ACUTE ONLY): 13 min  Past Medical History:  Past Medical History  Diagnosis Date  . Parkinson disease   . Dementia    Past Surgical History: No past surgical history on file. HPI:  81 y.o. with PMH significant for Parkinson's disease, depression, GERD, HTN that presented to ED via EMS after she was found to be hypoxemic at her PCP's office. Intubated after vomitting in ED and concern for aspiration 11/11-11/13. CXR decreased left sided pulmonary vascular congestion.   Assessment / Plan / Recommendation Clinical Impression  Pt exhibited difficulty managing secretions indicated by wet congested respirations and inability to produce effective volitional cough. SLP repositioned pt in bed and orally suctioned in attemtps to initiate reflexive cough unsuccessfully. Oral holding, labial spill with ice chip and swallow not initiated. MD arrived to assess pt and stated "pt unable to protect airway" and will discuss re-intubation with family. ST will sign off. Please re-order if/when appropriate.      Aspiration Risk  Severe aspiration risk    Diet Recommendation   NPO       Other  Recommendations Oral Care Recommendations: Oral care QID   Follow up Recommendations    ST will sign off    Frequency and Duration            Swallow Study   General HPI: 81 y.o. with PMH significant for Parkinson's disease, depression, GERD, HTN that presented to ED via EMS after she was found to be hypoxemic at her PCP's office. Intubated after vomitting in ED and concern for aspiration 11/11-11/13. CXR decreased left sided pulmonary vascular congestion. Type of Study: Bedside Swallow Evaluation Previous Swallow Assessment:  (none) Diet Prior to this Study:  NPO Temperature Spikes Noted: Yes Respiratory Status: Nasal cannula History of Recent Intubation: Yes Length of Intubations (days):  (3) Date extubated: 11/09/15 Behavior/Cognition: Lethargic/Drowsy;Requires cueing Oral Cavity Assessment: Excessive secretions Oral Care Completed by SLP: Yes Oral Cavity - Dentition:  (natural dentition, will assess further) Vision:  (keeps eyes closed) Self-Feeding Abilities: Total assist Patient Positioning: Upright in bed Baseline Vocal Quality: Wet Volitional Cough: Weak Volitional Swallow: Unable to elicit    Oral/Motor/Sensory Function Overall Oral Motor/Sensory Function:  (pt unable to perform)   Ice Chips Ice chips: Impaired Presentation: Spoon Oral Phase Impairments: Reduced labial seal;Reduced lingual movement/coordination;Poor awareness of bolus Oral Phase Functional Implications: Right anterior spillage;Oral holding Pharyngeal Phase Impairments:  (no swallow initiated)   Thin Liquid Thin Liquid: Not tested    Nectar Thick Nectar Thick Liquid: Not tested   Honey Thick Honey Thick Liquid: Not tested   Puree Puree: Not tested   Solid Solid: Not tested       Royce MacadamiaLitaker, Shadman Tozzi Willis 11/10/2015,12:27 PM  Breck CoonsLisa Willis Lonell FaceLitaker M.Ed [employer] Pager [phone]

## 2015-11-10 NOTE — Progress Notes (Signed)
Extubated yesterday afternoon. MS not improved. Has has difficulty w/ clearing airway, poor cough mechanics, and progressive worsening work of breathing. I sat down w/ her daughter Shawna AngCathy at the bedside. We discussed pt's current status, and overall prognosis. She does not want her mother to suffer. At the end of our discussion the following plan of care was decided: -Full DNR -DNI -NO BIPAP -NO FEEDING TUBE -cont IVFs, and ABX for short term.  -if worsens would transition to comfort -otherwise cont current care.  -if she gets better would look at comfort feeds etc...   Shawna Torres ACNP-BC [facility] Pulmonary/Critical Care Pager # [phone] OR # [phone] if no answer

## 2015-11-11 DIAGNOSIS — B348 Other viral infections of unspecified site: Secondary | ICD-10-CM

## 2015-11-11 DIAGNOSIS — J690 Pneumonitis due to inhalation of food and vomit: Secondary | ICD-10-CM

## 2015-11-11 DIAGNOSIS — J181 Lobar pneumonia, unspecified organism: Secondary | ICD-10-CM | POA: Insufficient documentation

## 2015-11-11 DIAGNOSIS — J9601 Acute respiratory failure with hypoxia: Secondary | ICD-10-CM | POA: Insufficient documentation

## 2015-11-11 LAB — CBC
HCT: 26.7 % — ABNORMAL LOW (ref 36.0–46.0)
HEMOGLOBIN: 8 g/dL — AB (ref 12.0–15.0)
MCH: 24.3 pg — ABNORMAL LOW (ref 26.0–34.0)
MCHC: 30 g/dL (ref 30.0–36.0)
MCV: 81.2 fL (ref 78.0–100.0)
Platelets: 120 10*3/uL — ABNORMAL LOW (ref 150–400)
RBC: 3.29 MIL/uL — AB (ref 3.87–5.11)
RDW: 19.4 % — ABNORMAL HIGH (ref 11.5–15.5)
WBC: 6.8 10*3/uL (ref 4.0–10.5)

## 2015-11-11 LAB — RESPIRATORY VIRUS PANEL
Adenovirus: NEGATIVE
Influenza A: NEGATIVE
Influenza B: NEGATIVE
METAPNEUMOVIRUS: NEGATIVE
PARAINFLUENZA 1 A: NEGATIVE
PARAINFLUENZA 2 A: NEGATIVE
PARAINFLUENZA 3 A: NEGATIVE
RESPIRATORY SYNCYTIAL VIRUS B: NEGATIVE
RHINOVIRUS: POSITIVE — AB
Respiratory Syncytial Virus A: NEGATIVE

## 2015-11-11 LAB — BASIC METABOLIC PANEL
ANION GAP: 6 (ref 5–15)
BUN: 15 mg/dL (ref 6–20)
CALCIUM: 8.5 mg/dL — AB (ref 8.9–10.3)
CO2: 30 mmol/L (ref 22–32)
Chloride: 106 mmol/L (ref 101–111)
Creatinine, Ser: 0.8 mg/dL (ref 0.44–1.00)
GLUCOSE: 105 mg/dL — AB (ref 65–99)
Potassium: 4 mmol/L (ref 3.5–5.1)
SODIUM: 142 mmol/L (ref 135–145)

## 2015-11-11 LAB — GLUCOSE, CAPILLARY
GLUCOSE-CAPILLARY: 103 mg/dL — AB (ref 65–99)
GLUCOSE-CAPILLARY: 86 mg/dL (ref 65–99)
GLUCOSE-CAPILLARY: 95 mg/dL (ref 65–99)
Glucose-Capillary: 88 mg/dL (ref 65–99)
Glucose-Capillary: 88 mg/dL (ref 65–99)
Glucose-Capillary: 97 mg/dL (ref 65–99)

## 2015-11-11 LAB — TRIGLYCERIDES: TRIGLYCERIDES: 67 mg/dL (ref <150)

## 2015-11-11 MED ORDER — PANTOPRAZOLE SODIUM 40 MG IV SOLR
40.0000 mg | INTRAVENOUS | Status: DC
  Administered 2015-11-12 – 2015-11-15 (×4): 40 mg via INTRAVENOUS
  Filled 2015-11-11 (×4): qty 40

## 2015-11-11 MED ORDER — CHLORHEXIDINE GLUCONATE 0.12 % MT SOLN
OROMUCOSAL | Status: AC
  Administered 2015-11-11: 15 mL
  Filled 2015-11-11: qty 15

## 2015-11-11 NOTE — Progress Notes (Signed)
Shawna Torres [id number] Admission Data: 11/11/2015 8:00 PM late entry for 1830 Attending Provider: Alyson ReedyWesam G Yacoub, MD  WUJ:WJXBJY,NWGNPCP:WILSON,FRED Shawna CooterHENRY, MD Consults/ Treatment Team:    Shawna BusmanJo Ann Torres is a 81 y.o. female patient admitted from ED awake, alert  & orientated  X 3,  DNR, VSS - Blood pressure 181/57, pulse 61, temperature 98.1 F (36.7 C), temperature source Oral, resp. rate 16, height 5\' 2"  (1.575 m), weight 47 kg (103 lb 9.9 oz), SpO2 96 %., O2    2 L nasal cannular, no c/o shortness of breath, no c/o chest pain, no distress noted.   IV site WDL:  forearm right, condition patent and no redness and left, condition patent and no redness and antecubital right, condition patent and no redness and left, condition patent and no redness with a transparent dsg that's clean dry and intact.  Allergies:   Allergies  Allergen Reactions  . Depakote [Divalproex Sodium] Other (See Comments)    Thrombocytopenia     Past Medical History  Diagnosis Date  . Parkinson disease   . Dementia     History:  obtained from chart review. Tobacco/alcohol: unknown tobacco use none  Pt orientation to unit, room and routine. Information packet given to patient/family and safety video watched.  Admission INP armband ID verified with patient/family, and in place. SR up x 2, fall risk assessment complete with Patient and family verbalizing understanding of risks associated with falls. Pt verbalizes an understanding of how to use the call bell and to call for help before getting out of bed.  Skin, clean-dry- intact without evidence of bruising, or skin tears.   No evidence of skin break down noted on exam. no rashes, no wounds    Will cont to monitor and assist as needed.  Zerenity Shawna Consuella Loselaine, RN 11/11/2015 8:00 PM

## 2015-11-11 NOTE — Progress Notes (Signed)
eLink Physician-Brief Progress Note Patient Name: Shawna Torres DOB: 11/05/1932 MRN: [id number]   Date of Service  11/11/2015  HPI/Events of Note  Spoke with daughter who states that she now wants a partial code status. She nor her mom want intubation and mechanical ventilation, however, she and her mom want all other emergent measure such as code blue, CPR and cardiac medications. NIV is also OK to provide.   eICU Interventions  Will amend code status to reflect the desires of the the patient and her daughter.      Intervention Category Minor Interventions: Routine modifications to care plan (e.g. PRN medications for pain, fever);Communication with other healthcare providers and/or family  Shawna Torres,Shawna Torres 11/11/2015, 9:12 PM

## 2015-11-11 NOTE — Progress Notes (Signed)
PULMONARY / CRITICAL CARE MEDICINE   Name: Shawna Torres Kochan MRN: [id number] DOB: 03/31/1932    ADMISSION DATE:  11/07/2015 CONSULTATION DATE:  11/07/15  REFERRING MD :  EDP  CHIEF COMPLAINT:  Respiratory failure/ sepsis  INITIAL PRESENTATION:  Shawna Torres Gosselin is a [age] y.o. that presented to ED via EMS after she was found to be hypoxemic at her PCP's office.  She required emergent intubation once she arrived to ED after vomiting.  There was concern for aspiration.  Of note, patient hospitalized in 2012, at which time she was intubated.  PMH significant for Parkinson's disease, depression, GERD, HTN.  Upon my evaluation, patient saturating 100% on vent.  She was hypothermic to 93 F.  STUDIES:   CXR (11/11): Findings suggesting mild vascular congestion. Left base opacification likely small effusion with associated atelectasis although cannot exclude infection. Moderate stable elevation of the right hemidiaphragm. Tubes and lines as described. Note that the endotracheal tube has tip approximately 1.6 cm above the carina.  CT head (11/11):  No acute abnormalities.  Significant atrophy and small vessel disease.  SIGNIFICANT EVENTS: 11/11: Intubated, admitted to ICU 11/12 decreased sedation 11/13 Extubated 11/14 Made DNR/DNI  SUBJECTIVE:  Extubated, chronically ill appearing.  VITAL SIGNS: Temp:  [98.1 F (36.7 C)-100.2 F (37.9 C)] 99.1 F (37.3 C) (11/15 0800) Pulse Rate:  [51-75] 66 (11/15 0900) Resp:  [10-24] 17 (11/15 0900) BP: (132-193)/(59-88) 186/80 mmHg (11/15 0900) SpO2:  [95 %-100 %] 100 % (11/15 0900) Weight:  [47 kg (103 lb 9.9 oz)] 47 kg (103 lb 9.9 oz) (11/15 0431) HEMODYNAMICS:   VENTILATOR SETTINGS:   INTAKE / OUTPUT:  Intake/Output Summary (Last 24 hours) at 11/11/15 0912 Last data filed at 11/11/15 0901  Gross per 24 hour  Intake 2257.5 ml  Output   1075 ml  Net 1182.5 ml    PHYSICAL EXAMINATION: General:  NAD, in bed Neuro:  Opens eyes to voice, does not  track, does not speak, does not follow commands.  Contracted LEs HEENT:  [place]/AT, increased oral secretions Cardiovascular:  RRR, no murmurs appreciated, no JVD appreciated Lungs:  Coarse breath sounds throughout, mild accessory muscle use, decreased right lung fields.  Abdomen:  Soft, ND, +BS Musculoskeletal:  LE with contractures, no edema, cool, LE pulses difficult to palpate Skin: bruising as above, no other lesions apparent  LABS:  CBC  Recent Labs Lab 11/09/15 0202 11/10/15 0211 11/11/15 0224  WBC 7.8 7.4 6.8  HGB 7.6* 8.0* 8.0*  HCT 24.5* 26.0* 26.7*  PLT 137* 140* 120*   Coag's No results for input(s): APTT, INR in the last 168 hours. BMET  Recent Labs Lab 11/09/15 0202 11/10/15 0211 11/11/15 0224  NA 137 140 142  K 4.1 4.1 4.0  CL 103 106 106  CO2 28 29 30   BUN 27* 24* 15  CREATININE 0.91 0.78 0.80  GLUCOSE 98 105* 105*   Electrolytes  Recent Labs Lab 11/09/15 0202  11/09/15 2243 11/10/15 0211 11/10/15 1113 11/10/15 2250 11/11/15 0224  CALCIUM 8.0*  --   --  8.2*  --   --  8.5*  MG  --   < > 2.1  --  1.9 1.8  --   PHOS  --   < > 4.5  --  3.3 2.8  --   < > = values in this interval not displayed. Sepsis Markers  Recent Labs Lab 11/07/15 1744 11/07/15 2205 11/07/15 2359 11/08/15 0240 11/09/15 0202  LATICACIDVEN 2.93* 2.1* 2.2*  --   --  PROCALCITON  --  <0.10  --  <0.10 0.14   ABG  Recent Labs Lab 11/07/15 2233 11/08/15 0338 11/09/15 0445  PHART 7.401 7.433 7.436  PCO2ART 46.0* 40.7 40.2  PO2ART 192.0* 136* 168*   Liver Enzymes  Recent Labs Lab 11/08/15 0240 11/09/15 0202 11/10/15 0211  AST 56* 40 30  ALT 18 7* 19  ALKPHOS 83 72 70  BILITOT 0.7 0.6 0.5  ALBUMIN 2.9* 2.6* 2.7*   Cardiac Enzymes No results for input(s): TROPONINI, PROBNP in the last 168 hours. Glucose  Recent Labs Lab 11/10/15 1157 11/10/15 1535 11/10/15 1914 11/10/15 2344 11/11/15 0340 11/11/15 0800  GLUCAP 92 97 103* 103* 95 88     Imaging No results found.Decreased aeration bilaterally; elevated R HD (chronic)  ASSESSMENT / PLAN:  PULMONARY OETT placed 11/11>>11/13 A: Respiratory failure ? Aspiration pneumonia  Chronically elevated Right HD P:   -Pt made DNI 11/14  - Antibiotics in ID section - Continuous pulse ox - O2 as needed   CARDIOVASCULAR H/o Hypertension  EKG 11/11: sinus with LBBB, stable from previous Trop neg A:  P:  - Telemetry - IVFs KVO   RENAL A:   Mildly hyponatremic-->resolved  Hypophosphatemia--> resolved Hypomagnesemia -->resolved   P:   - Monitor BMET - Replete electrolytes as needed - Foley care  GASTROINTESTINAL A:   H/o GERD P:   - Stress dose Pepcid  HEMATOLOGIC A:  Anemia of chronic disease.   No evidence of bleeding. Hgb Stable P:  - Monitor CBC - Cont [place] heparin   INFECTIOUS A:   Sepsis ? Aspiration pneumonia  Hypothermia resolved P:   RVP (11/11)>Rhinovirus BCx2 (11/11)>> NG x3d UC (11/11)>> insignificant growth <2k colonies Aspirate cx (11/11)>>neg Legionella Ag (11/11)>>neg Urine Strep Ag (11/11) >>neg  Abx:   - Vanc 11/11>>11/14 - Zosyn 11/11>>11/14 - Levaquin 11/11>>11/14 - Unasyn 11/14>>>  - PCT protocol noted - Monitor temp.  - CBC in am  ENDOCRINE A:   No h/o endocrine d/o   P:   - Monitor CBGs - Avoid hypoglycemia  NEUROLOGIC A:   Parkinson's disease on Carbidopa/Levodopa at home P:   - RASS goal: 0 - Restarted parkinson's meds vt    FAMILY  - Updates: 11/14 with Daughter and caregiver at bedside. Pt made FULL DNR/DNI. No feeding tube. Continue IVFs/atbx   - Inter-disciplinary family meet or Palliative Care meeting due by:  11/18  Today's summary: 68 chronically ill female with history of Parkinson's dementia, hypertension, GERD, and anemia recently intubated for aspiration pna on 11/11>>extubated on 11/13. Pt with deteriorating resp status on 11/14 and inability to protect airway. Discussion with family  11/14 indicated Full DNR/DNI, no feeding tube. Would like to continue IVF/atbx. Pt with increased responsiveness 11/15. Transfer to floor for continued care. Consider palliation; discussed with family 11/15. Daughter is reluctant to begin palliation/hospice due to previous "bad experience" with hospice. Wants to discuss with son before making final decision for palliation.

## 2015-11-11 NOTE — Progress Notes (Signed)
Received report from [place] on [id number].

## 2015-11-12 ENCOUNTER — Inpatient Hospital Stay (HOSPITAL_COMMUNITY): Payer: Medicare Other

## 2015-11-12 DIAGNOSIS — J9600 Acute respiratory failure, unspecified whether with hypoxia or hypercapnia: Secondary | ICD-10-CM

## 2015-11-12 LAB — CULTURE, BLOOD (ROUTINE X 2)
CULTURE: NO GROWTH
Culture: NO GROWTH

## 2015-11-12 LAB — GLUCOSE, CAPILLARY
GLUCOSE-CAPILLARY: 100 mg/dL — AB (ref 65–99)
GLUCOSE-CAPILLARY: 86 mg/dL (ref 65–99)
Glucose-Capillary: 101 mg/dL — ABNORMAL HIGH (ref 65–99)
Glucose-Capillary: 91 mg/dL (ref 65–99)
Glucose-Capillary: 95 mg/dL (ref 65–99)
Glucose-Capillary: 96 mg/dL (ref 65–99)

## 2015-11-12 MED ORDER — DEXTROSE 5 % IV SOLN
INTRAVENOUS | Status: AC
  Administered 2015-11-12: 15:00:00 via INTRAVENOUS

## 2015-11-12 MED ORDER — RESOURCE THICKENUP CLEAR PO POWD
ORAL | Status: DC | PRN
  Filled 2015-11-12: qty 125

## 2015-11-12 MED ORDER — HYDRALAZINE HCL 20 MG/ML IJ SOLN
10.0000 mg | Freq: Four times a day (QID) | INTRAMUSCULAR | Status: DC | PRN
  Administered 2015-11-12 – 2015-11-16 (×4): 10 mg via INTRAVENOUS
  Filled 2015-11-12 (×5): qty 1

## 2015-11-12 MED ORDER — HYDRALAZINE HCL 50 MG PO TABS
50.0000 mg | ORAL_TABLET | Freq: Three times a day (TID) | ORAL | Status: DC
  Filled 2015-11-12 (×4): qty 1

## 2015-11-12 MED ORDER — DEXTROSE 5 % IV SOLN: INTRAVENOUS | Status: DC

## 2015-11-12 MED ORDER — FUROSEMIDE 10 MG/ML IJ SOLN
40.0000 mg | Freq: Once | INTRAMUSCULAR | Status: AC
  Administered 2015-11-12: 40 mg via INTRAVENOUS
  Filled 2015-11-12: qty 4

## 2015-11-12 MED ORDER — CARVEDILOL 3.125 MG PO TABS
3.1250 mg | ORAL_TABLET | Freq: Two times a day (BID) | ORAL | Status: DC
  Administered 2015-11-14: 3.125 mg via ORAL
  Filled 2015-11-12 (×3): qty 1

## 2015-11-12 MED ORDER — FUROSEMIDE 10 MG/ML IJ SOLN: 40.0000 mg | Freq: Once | INTRAMUSCULAR | Status: DC

## 2015-11-12 NOTE — Care Management Important Message (Signed)
Important Message  Patient Details  Name: Freida BusmanJo Ann Recktenwald MRN: [id number] Date of Birth: 02/13/1932   Medicare Important Message Given:  Yes    Kyla BalzarineShealy, Dontrey [place]ell[place]ve [place]ena 11/12/2015, 3:37 PM

## 2015-11-12 NOTE — Progress Notes (Signed)
Nutrition Follow-up  DOCUMENTATION CODES:   Not applicable  INTERVENTION:   -RD will follow for diet advancement and further recommendations based upon GOC  NUTRITION DIAGNOSIS:   Inadequate oral intake related to inability to eat as evidenced by NPO status.  Ongoing  GOAL:   Patient will meet greater than or equal to 90% of their needs  Unmet  MONITOR:   Diet advancement, Labs, Weight trends, Skin, I & O's  REASON FOR ASSESSMENT:   Consult Enteral/tube feeding initiation and management  ASSESSMENT:   Shawna Torres is a 81 y.o. that presented to ED via EMS after she was found to be hypoxemic at her PCP's office. She required emergent intubation once she arrived to ED after vomiting. There was concern for aspiration. Of note, patient hospitalized in 2012, at which time she was intubated. PMH significant for Parkinson's disease, depression, GERD, HTN. Upon my evaluation, patient saturating 100% on vent. She was hypothermic to 93 F.  Pt extubated on 11/09/15. Pt was transferred out of ICU to medical floor on 11/11/15.   Pt remains NPO. She underwent BSE by SLP on 11/10/15, who recommended continued NPO due to severe aspiration risk. Per CCM notes, pt was unable to protect her airway at that time.   Pt and family have declined feeding tube. Additionally, pt daughter has refused a palliative care consult. Per MD notes, pt is a DNR and has very poor prognosis.   Labs reviewed.   Diet Order:  Diet NPO time specified  Skin:  Reviewed, no issues  Last BM:  11/11/15  Height:   Ht Readings from Last 1 Encounters:  11/07/15 5\' 2"  (1.575 m)    Weight:   Wt Readings from Last 1 Encounters:  11/12/15 104 lb 0.9 oz (47.2 kg)    Ideal Body Weight:  50 kg  BMI:  Body mass index is 19.03 kg/(m^2).  Estimated Nutritional Needs:   Kcal:  1200-1400  Protein:  50-60 grams  Fluid:  1.2-1.4 L  EDUCATION NEEDS:   No education needs identified at this  time  Dannya Pitkin A. Mayford KnifeWilliams, RD, LDN, CDE Pager: [phone] After hours Pager: [phone]

## 2015-11-12 NOTE — Evaluation (Signed)
Clinical/Bedside Swallow Evaluation Patient Details  Name: Shawna BusmanJo Ann Nethery MRN: [id number] Date of Birth: 09/15/1932  Today's Date: 11/12/2015 Time: SLP Start Time (ACUTE ONLY): 1519 SLP Stop Time (ACUTE ONLY): 1545 SLP Time Calculation (min) (ACUTE ONLY): 26 min  Past Medical History:  Past Medical History  Diagnosis Date  . Parkinson disease   . Dementia    Past Surgical History: No past surgical history on file. HPI:  81 y.o. with PMH significant for Parkinson's disease, depression, GERD, HTN that presented to ED via EMS after she was found to be hypoxemic at her PCP's office. Intubated after vomitting in ED and concern for aspiration 11/11-11/13. CXR decreased left sided pulmonary vascular congestion. BSE 11/14 recommended continue NPO with MD considering re-intubation. Pt improved somewhat, transferred to floor and repeat BSE recommended. CXR 11/16 Interval development of moderate bilateral pleural effusions and slight perihilar pulmonary  Per MD note, family declining Palliative services.   Assessment / Plan / Recommendation Clinical Impression  Pt frail, decreased alertness (adequate for assessment), speech mostly incomprehensible. Improved management of oral and pharyngeal secretions compared to initial assessment (11/14). She is not appropriate for modified barium swallow due to poor positioning (contractures) or FEES. Consumed ice chip and puree with s/s aspiration. Risk is high with any po's at present and was discussed with family (daughter and son) who opted for initiating food/liquid. Pt is a full code at present and not certain that daughter and son fully comprehend aspiration/respiratory distress/future admissions etc and would benefit from further education re: what the "big picture" of comfort feeds entails. Will continue to follow.     Aspiration Risk  Severe aspiration risk    Diet Recommendation     Medication Administration: Crushed with puree    Other  Recommendations  Oral Care Recommendations: Oral care QID Other Recommendations: Order thickener from pharmacy   Follow up Recommendations  None    Frequency and Duration min 1 x/week  1 week       Swallow Study   General HPI: 81 y.o. with PMH significant for Parkinson's disease, depression, GERD, HTN that presented to ED via EMS after she was found to be hypoxemic at her PCP's office. Intubated after vomitting in ED and concern for aspiration 11/11-11/13. CXR decreased left sided pulmonary vascular congestion. BSE 11/14 recommended continue NPO with MD considering re-intubation. Pt improved somewhat, transferred to floor and repeat BSE recommended. CXR 11/16 Interval development of moderate bilateral pleural effusions and slight perihilar pulmonary  Per MD note, family declining Palliative services. Type of Study: Bedside Swallow Evaluation Previous Swallow Assessment:  (see HPI) Diet Prior to this Study: NPO Temperature Spikes Noted: No Respiratory Status: Room air History of Recent Intubation: Yes Length of Intubations (days): 3 days Date extubated: 11/09/15 Behavior/Cognition: Cooperative;Pleasant mood;Lethargic/Drowsy;Requires cueing Oral Cavity Assessment: Dry Oral Care Completed by SLP: Yes Vision: Impaired for self-feeding Self-Feeding Abilities: Total assist Patient Positioning: Upright in bed (contractures) Baseline Vocal Quality: Low vocal intensity Volitional Cough: Weak Volitional Swallow: Unable to elicit    Oral/Motor/Sensory Function Overall Oral Motor/Sensory Function: Generalized oral weakness   Ice Chips Ice chips: Impaired Presentation: Spoon Oral Phase Impairments: Reduced labial seal;Reduced lingual movement/coordination Oral Phase Functional Implications: Right anterior spillage Pharyngeal Phase Impairments: Suspected delayed Swallow;Decreased hyoid-laryngeal movement;Cough - Delayed   Thin Liquid Thin Liquid: Not tested    Nectar Thick Nectar Thick Liquid: Not tested    Honey Thick Honey Thick Liquid: Not tested   Puree Puree: Impaired Presentation: Spoon Oral Phase Impairments: Reduced  labial seal;Reduced lingual movement/coordination Oral Phase Functional Implications: Prolonged oral transit Pharyngeal Phase Impairments: Suspected delayed Swallow;Decreased hyoid-laryngeal movement;Cough - Delayed   Solid Solid: Not tested       Royce Macadamia 11/12/2015,4:25 PM  Breck Coons Lonell Face.Ed [employer] [phone]

## 2015-11-12 NOTE — Progress Notes (Addendum)
Patient Demographics:    Shawna Torres, is a 81 y.o. female, DOB - 06/04/1932, GNF:[id number]  Admit date - 11/07/2015   Admitting Physician Roslynn AmbleJennings E Nestor, MD  Outpatient Primary MD for the patient is Pamelia HoitWILSON,FRED HENRY, MD  LOS - 5  Summary  Shawna Torres is a 81 y.o. that presented to ED via EMS after she was found to be hypoxemic at her PCP's office. She required emergent intubation once she arrived to ED after vomiting. There was concern for aspiration. Of note, patient hospitalized in 2012, at which time she was intubated. PMH significant for Parkinson's disease, depression, GERD, HTN. Upon my evaluation, patient saturating 100% on vent. She was hypothermic to 793 F and clinically had sepsis.  She was intubated and kept in ICU by pulmonary critical care, she was treated with antibiotics, subsequently extubated antibiotics tapered down and transferred to hospitalist service under my care on 11/12/2015. She continues to have a very poor cough reflex, has initially failed speech evaluation, daughter who is the primary decision maker has currently made her DO NOT RESUSCITATE but she refuses palliative care at this time despite extensive counseling by me.   Chief Complaint  Patient presents with  . Altered Mental Status        Subjective:    Shawna Torres today has, No headache, No chest pain, No abdominal pain - No Nausea, No new weakness tingling or numbness, mild cough but no shortness of breath.   Assessment  & Plan :     1. Acute on chronic hypoxic respiratory failure due to aspiration pneumonia causing toxic encephalopathy. Initially required intubation and admission to ICU by pulmonary critical care, treated with appropriate antibiotics currently on Unasyn and stop date 11/13/2015. Cultures  suggestive of rhinovirus but no bacterial agents are positive this far. Continue oxygen and supportive care, has initially failed therapy therapy evaluation and will request them to evaluate one more time.   Remains to have a very poor cough reflex and appears to be extremely frail and cachectic which increases her future chances of aspiration and respiratory failure. Discussed with patient's daughter who is agreed for DO NOT RESUSCITATE but remains resistant to words palliative care. Long term prognosis appears very poor. O2 patient has chronically elevated right hemidiaphragm.   After D/W Nursing staff they have now switched from DNI to Full code.   2. History of hypertension and chronic left bundle branch block. Placed on Coreg along with hydralazine will monitor blood pressure.   3. History of Parkinson's disease, bedbound status with generalized weakness and cachexia. Continue carbidopa levodopa combination along with supportive care.   4. Anemia of chronic disease. Stable monitor.   5. GERD. PPI IV for now as nothing by mouth.    Code Status : Full  Family Communication  : Daughter bedside, clearly described extremely poor prognosis, advised Pall care which she refused.  After D/W Nursing staff they have now switched from DNI to Full code.  Disposition Plan  : Home 1-2 days  Consults  :  Pulmonary critical care, speech, patient's daughter primary decision maker refused palliative care  Procedures  :   11/11: Intubated, admitted to ICU 11/12 decreased sedation 11/13 Extubated 11/14 Made DNR/DNI  CT head no acute  abnormality.  Echo in 2015 grade 1 diastolic dysfunction with EF of 50%.   DVT Prophylaxis  :    Heparin    Lab Results  Component Value Date   PLT 120* 11/11/2015    Inpatient Medications  Scheduled Meds: . ampicillin-sulbactam (UNASYN) IV  3 g Intravenous Q6H  . antiseptic oral rinse  7 mL Mouth Rinse QID  . carbidopa-levodopa  1 tablet Per Tube  BID  . carvedilol  3.125 mg Oral BID WC  . chlorhexidine gluconate  15 mL Mouth Rinse BID  . heparin  5,000 Units Subcutaneous 3 times per day  . hydrALAZINE  50 mg Oral 3 times per day  . insulin aspart  0-9 Units Subcutaneous 6 times per day  . pantoprazole (PROTONIX) IV  40 mg Intravenous Q24H   Continuous Infusions: . dextrose 5% lactated ringers 60 mL/hr at 11/11/15 2135   PRN Meds:.  Antibiotics  :     Anti-infectives    Start     Dose/Rate Route Frequency Ordered Stop   11/10/15 1600  Ampicillin-Sulbactam (UNASYN) 3 g in sodium chloride 0.9 % 100 mL IVPB     3 g 100 mL/hr over 60 Minutes Intravenous Every 6 hours 11/10/15 1147 11/13/15 2359   11/08/15 1800  vancomycin (VANCOCIN) IVPB 750 mg/150 ml premix  Status:  Discontinued     750 mg 150 mL/hr over 60 Minutes Intravenous Every 24 hours 11/07/15 2031 11/10/15 1143   11/08/15 0000  piperacillin-tazobactam (ZOSYN) IVPB 3.375 g  Status:  Discontinued     3.375 g 12.5 mL/hr over 240 Minutes Intravenous Every 8 hours 11/07/15 2030 11/07/15 2032   11/08/15 0000  piperacillin-tazobactam (ZOSYN) IVPB 3.375 g  Status:  Discontinued     3.375 g 12.5 mL/hr over 240 Minutes Intravenous Every 8 hours 11/07/15 2032 11/10/15 1143   11/07/15 2200  levofloxacin (LEVAQUIN) IVPB 750 mg  Status:  Discontinued     750 mg 100 mL/hr over 90 Minutes Intravenous Every 24 hours 11/07/15 2118 11/07/15 2132   11/07/15 2145  doxycycline (VIBRAMYCIN) 100 mg in dextrose 5 % 250 mL IVPB  Status:  Discontinued     100 mg 125 mL/hr over 120 Minutes Intravenous Every 12 hours 11/07/15 2132 11/10/15 1143   11/07/15 2115  levofloxacin (LEVAQUIN) tablet 750 mg  Status:  Discontinued     750 mg Oral Daily 11/07/15 2101 11/07/15 2118   11/07/15 1745  vancomycin (VANCOCIN) IVPB 1000 mg/200 mL premix     1,000 mg 200 mL/hr over 60 Minutes Intravenous  Once 11/07/15 1734 11/07/15 1902   11/07/15 1745  piperacillin-tazobactam (ZOSYN) IVPB 3.375 g     3.375  g 100 mL/hr over 30 Minutes Intravenous  Once 11/07/15 1734 11/07/15 1829        Objective:   Filed Vitals:   11/11/15 1800 11/11/15 1844 11/11/15 2127 11/12/15 0513  BP: 177/76 181/57 169/75 193/77  Pulse: 57 61 60 62  Temp:  98.1 F (36.7 C) 97.7 F (36.5 C) 97.9 F (36.6 C)  TempSrc:  Oral Oral Oral  Resp: 11 16 18 18   Height:      Weight:    47.2 kg (104 lb 0.9 oz)  SpO2: 100% 96% 100% 97%    Wt Readings from Last 3 Encounters:  11/12/15 47.2 kg (104 lb 0.9 oz)  07/27/14 47.945 kg (105 lb 11.2 oz)     Intake/Output Summary (Last 24 hours) at 11/12/15 1033 Last data filed at 11/12/15 0900  Gross per 24 hour  Intake   1507 ml  Output    155 ml  Net   1352 ml     Physical Exam  Frail elderly cachectic Caucasian female lying in hospital bed appears to have chronic contractures and severe deconditioning, with No new F.N deficits, flat affect [place].AT,PERRAL Supple Neck,No JVD, No cervical lymphadenopathy appriciated.  Symmetrical Chest wall movement, Good air movement bilaterally, CTAB RRR,No Gallops,Rubs or new Murmurs, No Parasternal Heave +ve B.Sounds, Abd Soft, No tenderness, No organomegaly appriciated, No rebound - guarding or rigidity. No Cyanosis, Clubbing or edema, No new Rash or bruise       Data Review:   Micro Results Recent Results (from the past 240 hour(s))  Blood Culture (routine x 2)     Status: None (Preliminary result)   Collection Time: 11/07/15  5:45 PM  Result Value Ref Range Status   Specimen Description BLOOD LEFT HAND  Final   Special Requests BOTTLES DRAWN AEROBIC AND ANAEROBIC 5CC  Final   Culture NO GROWTH 4 DAYS  Final   Report Status PENDING  Incomplete  Blood Culture (routine x 2)     Status: None (Preliminary result)   Collection Time: 11/07/15  5:52 PM  Result Value Ref Range Status   Specimen Description BLOOD RIGHT HAND  Final   Special Requests BOTTLES DRAWN AEROBIC AND ANAEROBIC 5CC  Final   Culture NO GROWTH 4 DAYS   Final   Report Status PENDING  Incomplete  Urine culture     Status: None   Collection Time: 11/07/15  5:56 PM  Result Value Ref Range Status   Specimen Description URINE, CATHETERIZED  Final   Special Requests NONE  Final   Culture 2,000 COLONIES/mL INSIGNIFICANT GROWTH  Final   Report Status 11/08/2015 FINAL  Final  MRSA PCR Screening     Status: Abnormal   Collection Time: 11/07/15  9:31 PM  Result Value Ref Range Status   MRSA by PCR INVALID RESULTS, SPECIMEN SENT FOR CULTURE (A) NEGATIVE Final    Comment:        The GeneXpert MRSA Assay (FDA approved for NASAL specimens only), is one component of a comprehensive MRSA colonization surveillance program. It is not intended to diagnose MRSA infection nor to guide or monitor treatment for MRSA infections. RESULT CALLED TO, READ BACK BY AND VERIFIED WITH: GREG @0113  11/08/15 MKELLY   MRSA culture     Status: None   Collection Time: 11/07/15  9:31 PM  Result Value Ref Range Status   Specimen Description NASAL SWAB  Final   Special Requests NONE  Final   Culture NOMRSA Performed at [facility]   Final   Report Status 11/10/2015 FINAL  Final  Culture, respiratory (NON-Expectorated)     Status: None   Collection Time: 11/07/15 10:38 PM  Result Value Ref Range Status   Specimen Description TRACHEAL ASPIRATE  Final   Special Requests NONE  Final   Gram Stain   Final    ABUNDANT WBC PRESENT,BOTH PMN AND MONONUCLEAR RARE SQUAMOUS EPITHELIAL CELLS PRESENT FEW GRAM POSITIVE COCCI IN PAIRS RARE GRAM NEGATIVE RODS Performed at [facility]    Culture   Final    Non-Pathogenic Oropharyngeal-type Flora Isolated. Performed at [facility]    Report Status 11/10/2015 FINAL  Final  Respiratory virus panel     Status: Abnormal   Collection Time: 11/07/15 10:56 PM  Result Value Ref Range Status   Respiratory Syncytial Virus A Negative  Negative Final   Respiratory Syncytial Virus B Negative Negative Final    Influenza A Negative Negative Final   Influenza B Negative Negative Final   Parainfluenza 1 Negative Negative Final   Parainfluenza 2 Negative Negative Final   Parainfluenza 3 Negative Negative Final   Metapneumovirus Negative Negative Final   Rhinovirus Positive (A) Negative Final   Adenovirus Negative Negative Final    Comment: (NOTE) Performed At: [facility] [address] [place], [place] [id number] Mila Homer MD BJ:[id number]     Radiology Reports Ct Head Wo Contrast  11/07/2015  CLINICAL DATA:  Per family altered mental status and patient's mental status declined in transport. Pt vomited x 1 today. EXAM: CT HEAD WITHOUT CONTRAST TECHNIQUE: Contiguous axial images were obtained from the base of the skull through the vertex without intravenous contrast. COMPARISON:  07/27/2014 FINDINGS: There is significant central and cortical atrophy. Severe periventricular white matter changes are consistent with small vessel disease and appear chronic. There is no intra or extra-axial fluid collection or mass lesion. The basilar cisterns and ventricles have a normal appearance. There is no CT evidence for acute infarction or hemorrhage. IMPRESSION: No evidence for acute intracranial abnormality. Significant atrophy and small vessel disease. Electronically Signed   By: Norva Pavlov M.D.   On: 11/07/2015 18:29   Dg Chest Port 1 View  11/12/2015  CLINICAL DATA:  Shortness of breath. EXAM: PORTABLE CHEST 1 VIEW COMPARISON:  11/09/2015, 11/07/2015 and 07/11/2015 FINDINGS: Endotracheal tube and NG tube have been removed. The patient has developed moderate bilateral pleural effusions. There is slight bilateral perihilar edema. No other change. IMPRESSION: Interval development of moderate bilateral pleural effusions and slight perihilar pulmonary edema. Electronically Signed   By: Francene Boyers M.D.   On: 11/12/2015 08:27   Dg Chest Port 1 View  11/09/2015  CLINICAL DATA:   81 year old female with pneumonia. EXAM: PORTABLE CHEST 1 VIEW COMPARISON:  11/07/2015 and prior chest radiographs FINDINGS: Cardiomediastinal silhouette is unchanged. Decreased left lung pulmonary vascular congestion noted. An endotracheal tube is identified with tip at the carina -recommend 2-3 cm retraction. Elevation of the right hemidiaphragm and bibasilar atelectasis/airspace disease again noted. There is no evidence of pneumothorax. An NG tube is identified with tip overlying the pre-pyloric region. IMPRESSION: Endotracheal tube with tip at the carina -recommend 2-3 cm retraction. Delsa Sale, nurse with this patient, was notified. Decreased left sided pulmonary vascular congestion. No other significant changes. Electronically Signed   By: Harmon Pier M.D.   On: 11/09/2015 07:37   Dg Chest Portable 1 View  11/07/2015  CLINICAL DATA:  Endotracheal tube placement. EXAM: PORTABLE CHEST 1 VIEW COMPARISON:  11/07/2015 and 07/11/2015 FINDINGS: Patient is moderately rotated to the right. Endotracheal tube has tip approximately 1.6 cm above the carina along the anterior tracheal wall. Nasogastric tube courses into the region of the stomach and off the inferior portion of the film. There is moderate elevation right hemidiaphragm unchanged. Hazy opacification of the left base likely combination of small effusion with atelectasis. There is prominence of the perihilar markings likely mild degree of vascular congestion. Remainder of the exam is unchanged. IMPRESSION: Findings suggesting mild vascular congestion. Left base opacification likely small effusion with associated atelectasis although cannot exclude infection. Moderate stable elevation of the right hemidiaphragm. Tubes and lines as described. Note that the endotracheal tube has tip approximately 1.6 cm above the carina. Electronically Signed   By: Elberta Fortis M.D.   On: 11/07/2015 19:19   Dg Chest Portable  1 View  11/07/2015  CLINICAL DATA:  Shortness of  breath.  Concern for aspiration. EXAM: PORTABLE CHEST 1 VIEW COMPARISON:  07/11/2015 FINDINGS: There is opacity at the left lung base which obscures most of the left hemidiaphragm. This may be atelectasis. Pneumonia or aspiration pneumonitis is possible. There may be a small associated pleural effusion. Remainder of the lungs is clear. Right hemidiaphragm is elevated, stable from prior study. No pneumothorax. There cardiac silhouette is partly obscured. Is grossly normal in size. No mediastinal or hilar masses are evident. Bony thorax is demineralized. Intra medullary rod noted in the right humerus reducing an old, healed proximal shaft fracture. IMPRESSION: 1. Left lung base opacity which is consistent with pneumonia, aspiration pneumonitis, atelectasis or a combination. Possible associated small effusion. No other acute finding. Electronically Signed   By: Amie Portland M.D.   On: 11/07/2015 17:38     CBC  Recent Labs Lab 11/07/15 1715 11/07/15 1723 11/08/15 0240 11/09/15 0202 11/10/15 0211 11/11/15 0224  WBC 8.6  --  17.4* 7.8 7.4 6.8  HGB 9.2* 11.6* 7.9* 7.6* 8.0* 8.0*  HCT 30.0* 34.0* 25.3* 24.5* 26.0* 26.7*  PLT 159  --  162 137* 140* 120*  MCV 79.4  --  78.3 78.5 79.8 81.2  MCH 24.3*  --  24.5* 24.4* 24.5* 24.3*  MCHC 30.7  --  31.2 31.0 30.8 30.0  RDW 18.3*  --  18.7* 19.1* 19.3* 19.4*  LYMPHSABS 0.7  --  0.3*  --   --   --   MONOABS 0.3  --  0.6  --   --   --   EOSABS 0.1  --  0.0  --   --   --   BASOSABS 0.0  --  0.0  --   --   --     Chemistries   Recent Labs Lab 11/07/15 1715  11/08/15 0240 11/08/15 1320 11/08/15 2218 11/09/15 0202 11/09/15 1050 11/09/15 2243 11/10/15 0211 11/10/15 1113 11/10/15 2250 11/11/15 0224  NA 134*  < > 134* 135  --  137  --   --  140  --   --  142  K 5.0  < > 5.2* 4.6  --  4.1  --   --  4.1  --   --  4.0  CL 99*  < > 100* 103  --  103  --   --  106  --   --  106  CO2 26  --  28 27  --  28  --   --  29  --   --  30  GLUCOSE 211*  < >  139* 74  --  98  --   --  105*  --   --  105*  BUN 28*  < > 24* 23*  --  27*  --   --  24*  --   --  15  CREATININE 0.87  < > 0.75 0.93  --  0.91  --   --  0.78  --   --  0.80  CALCIUM 9.0  --  8.4* 8.0*  --  8.0*  --   --  8.2*  --   --  8.5*  MG  --   < > 1.4* 1.5* 2.9*  --  2.3 2.1  --  1.9 1.8  --   AST 48*  --  56*  --   --  40  --   --  30  --   --   --  ALT 9*  --  18  --   --  7*  --   --  19  --   --   --   ALKPHOS 92  --  83  --   --  72  --   --  70  --   --   --   BILITOT 0.3  --  0.7  --   --  0.6  --   --  0.5  --   --   --   < > = values in this interval not displayed. ------------------------------------------------------------------------------------------------------------------ estimated creatinine clearance is 41.1 mL/min (by C-G formula based on Cr of 0.8). ------------------------------------------------------------------------------------------------------------------ No results for input(s): HGBA1C in the last 72 hours. ------------------------------------------------------------------------------------------------------------------  Recent Labs  11/10/15 0211 11/11/15 0224  TRIG 34 67   ------------------------------------------------------------------------------------------------------------------ No results for input(s): TSH, T4TOTAL, T3FREE, THYROIDAB in the last 72 hours.  Invalid input(s): FREET3 ------------------------------------------------------------------------------------------------------------------ No results for input(s): VITAMINB12, FOLATE, FERRITIN, TIBC, IRON, RETICCTPCT in the last 72 hours.  Coagulation profile No results for input(s): INR, PROTIME in the last 168 hours.  No results for input(s): DDIMER in the last 72 hours.  Cardiac Enzymes No results for input(s): CKMB, TROPONINI, MYOGLOBIN in the last 168 hours.  Invalid input(s):  CK ------------------------------------------------------------------------------------------------------------------ Invalid input(s): POCBNP   Time Spent in minutes  35   Aadan Chenier K M.D on 11/12/2015 at 10:33 AM  Between 7am to 7pm - Pager - [phone]  After 7pm go to [email or web address] - password [facility]  [facility] -  Office  [phone]

## 2015-11-13 ENCOUNTER — Inpatient Hospital Stay (HOSPITAL_COMMUNITY): Payer: Medicare Other

## 2015-11-13 LAB — GLUCOSE, CAPILLARY
GLUCOSE-CAPILLARY: 76 mg/dL (ref 65–99)
GLUCOSE-CAPILLARY: 88 mg/dL (ref 65–99)
GLUCOSE-CAPILLARY: 95 mg/dL (ref 65–99)
GLUCOSE-CAPILLARY: 97 mg/dL (ref 65–99)
Glucose-Capillary: 93 mg/dL (ref 65–99)
Glucose-Capillary: 76 mg/dL (ref 65–99)

## 2015-11-13 MED ORDER — FUROSEMIDE 10 MG/ML IJ SOLN
40.0000 mg | Freq: Once | INTRAMUSCULAR | Status: AC
  Administered 2015-11-13: 40 mg via INTRAVENOUS
  Filled 2015-11-13: qty 4

## 2015-11-13 MED ORDER — DEXTROSE 5 % IV SOLN
INTRAVENOUS | Status: DC
  Administered 2015-11-13: 17:00:00 via INTRAVENOUS

## 2015-11-13 NOTE — Progress Notes (Addendum)
Nutrition Follow-up  DOCUMENTATION CODES:   Not applicable  INTERVENTION:   -RD will continue for diet advancement and make further recommendations based upon goals of care  NUTRITION DIAGNOSIS:   Inadequate oral intake related to lethargy/confusion as evidenced by meal completion < 25%.  Ongoing  GOAL:   Patient will meet greater than or equal to 90% of their needs  Unmet  MONITOR:   Diet advancement, Labs, Weight trends, Skin, I & O's  REASON FOR ASSESSMENT:   Consult Enteral/tube feeding initiation and management  ASSESSMENT:   Shawna Torres is a 81 y.o. that presented to ED via EMS after she was found to be hypoxemic at her PCP's office. She required emergent intubation once she arrived to ED after vomiting. There was concern for aspiration. Of note, patient hospitalized in 2012, at which time she was intubated. PMH significant for Parkinson's disease, depression, GERD, HTN. Upon my evaluation, patient saturating 100% on vent. She was hypothermic to 93 F.  Pt has been advanced to a dysphagia q diet with nectar thick liquids. Per SLP note, pt remains at a high aspiration risk and goal for feeding is more towards comfort. Pt has had very minimal intake (a few ice chips) as pt not been alert enough to take po's.   Per chart review, plan is likely to d/c home with hospice. [facility] liaison plans to come speak with family about hospice/palliative care services.   Labs reviewed.  Diet Order:  DIET - DYS 1 Room service appropriate?: Yes; Fluid consistency:: Nectar Thick  Skin:  Reviewed, no issues  Last BM:  11/13/15  Height:   Ht Readings from Last 1 Encounters:  11/07/15 5\' 2"  (1.575 m)    Weight:   Wt Readings from Last 1 Encounters:  11/13/15 93 lb 9.6 oz (42.457 kg)    Ideal Body Weight:  50 kg  BMI:  Body mass index is 17.12 kg/(m^2).  Estimated Nutritional Needs:   Kcal:  1200-1400  Protein:  50-60 grams  Fluid:  1.2-1.4 L  EDUCATION NEEDS:    No education needs identified at this time  Alycia Cooperwood A. Mayford KnifeWilliams, RD, LDN, CDE Pager: [phone] After hours Pager: [phone]

## 2015-11-13 NOTE — Progress Notes (Signed)
PULMONARY / CRITICAL CARE MEDICINE   Name: Shawna Torres MRN: [id number] DOB: 06/16/1932    ADMISSION DATE:  11/07/2015 CONSULTATION DATE:  11/07/15  REFERRING MD :  EDP  CHIEF COMPLAINT:  Respiratory failure/ sepsis  INITIAL PRESENTATION:  Shawna Torres is a 81 y.o. that presented to ED via EMS after she was found to be hypoxemic at her PCP's office.  She required emergent intubation once she arrived to ED after vomiting.  There was concern for aspiration.  Of note, patient hospitalized in 2012, at which time she was intubated.  PMH significant for Parkinson's disease, depression, GERD, HTN.  Upon my evaluation, patient saturating 100% on vent.  She was hypothermic to 93 F.  STUDIES:   CXR (11/11): Findings suggesting mild vascular congestion. Left base opacification likely small effusion with associated atelectasis although cannot exclude infection. Moderate stable elevation of the right hemidiaphragm. Tubes and lines as described. Note that the endotracheal tube has tip approximately 1.6 cm above the carina.  CT head (11/11):  No acute abnormalities.  Significant atrophy and small vessel disease.  SIGNIFICANT EVENTS: 11/11: Intubated, admitted to ICU 11/12 decreased sedation 11/13 Extubated 11/14 Made DNR/DNI 11/16 Changed to full code per daughter  SUBJECTIVE:  Contracted frail , minimally responsive   VITAL SIGNS: Temp:  [97.6 F (36.4 C)-98.5 F (36.9 C)] 98.5 F (36.9 C) (11/17 0500) Pulse Rate:  [57-78] 78 (11/17 0701) Resp:  [22] 22 (11/17 0701) BP: (146-183)/(56-77) 146/63 mmHg (11/17 0701) SpO2:  [96 %-100 %] 98 % (11/17 0701) Weight:  [93 lb 9.6 oz (42.457 kg)] 93 lb 9.6 oz (42.457 kg) (11/17 0500) HEMODYNAMICS:   VENTILATOR SETTINGS:   INTAKE / OUTPUT:  Intake/Output Summary (Last 24 hours) at 11/13/15 0751 Last data filed at 11/13/15 0600  Gross per 24 hour  Intake 949.17 ml  Output      0 ml  Net 949.17 ml    PHYSICAL EXAMINATION: General:  NAD, in  bed Neuro:  Opens eyes to voice, does not track, does not speak, does not follow commands.  Contracted LEs, mumbles HEENT:  [place]/AT, increased oral secretions Cardiovascular:  RRR, no murmurs appreciated, no JVD appreciated Lungs:  Coarse breath sounds throughout, mild accessory muscle use, decreased right lung fields. Gurgling respirations.  Abdomen:  Soft, ND, +BS Musculoskeletal:  LE with contractures, no edema, cool, LE pulses difficult to palpate Skin: bruising as above, no other lesions apparent  LABS:  CBC  Recent Labs Lab 11/09/15 0202 11/10/15 0211 11/11/15 0224  WBC 7.8 7.4 6.8  HGB 7.6* 8.0* 8.0*  HCT 24.5* 26.0* 26.7*  PLT 137* 140* 120*   Coag's No results for input(s): APTT, INR in the last 168 hours. BMET  Recent Labs Lab 11/09/15 0202 11/10/15 0211 11/11/15 0224  NA 137 140 142  K 4.1 4.1 4.0  CL 103 106 106  CO2 28 29 30   BUN 27* 24* 15  CREATININE 0.91 0.78 0.80  GLUCOSE 98 105* 105*   Electrolytes  Recent Labs Lab 11/09/15 0202  11/09/15 2243 11/10/15 0211 11/10/15 1113 11/10/15 2250 11/11/15 0224  CALCIUM 8.0*  --   --  8.2*  --   --  8.5*  MG  --   < > 2.1  --  1.9 1.8  --   PHOS  --   < > 4.5  --  3.3 2.8  --   < > = values in this interval not displayed. Sepsis Markers  Recent Labs Lab 11/07/15 1744  11/07/15 2205 11/07/15 2359 11/08/15 0240 11/09/15 0202  LATICACIDVEN 2.93* 2.1* 2.2*  --   --   PROCALCITON  --  <0.10  --  <0.10 0.14   ABG  Recent Labs Lab 11/07/15 2233 11/08/15 0338 11/09/15 0445  PHART 7.401 7.433 7.436  PCO2ART 46.0* 40.7 40.2  PO2ART 192.0* 136* 168*   Liver Enzymes  Recent Labs Lab 11/08/15 0240 11/09/15 0202 11/10/15 0211  AST 56* 40 30  ALT 18 7* 19  ALKPHOS 83 72 70  BILITOT 0.7 0.6 0.5  ALBUMIN 2.9* 2.6* 2.7*   Cardiac Enzymes No results for input(s): TROPONINI, PROBNP in the last 168 hours. Glucose  Recent Labs Lab 11/12/15 0410 11/12/15 0805 11/12/15 1225 11/12/15 2029  11/12/15 2355 11/13/15 0354  GLUCAP 101* 100* 95 91 86 95    Imaging Dg Chest Port 1 View  11/12/2015  CLINICAL DATA:  Shortness of breath. EXAM: PORTABLE CHEST 1 VIEW COMPARISON:  11/09/2015, 11/07/2015 and 07/11/2015 FINDINGS: Endotracheal tube and NG tube have been removed. The patient has developed moderate bilateral pleural effusions. There is slight bilateral perihilar edema. No other change. IMPRESSION: Interval development of moderate bilateral pleural effusions and slight perihilar pulmonary edema. Electronically Signed   By: Francene Boyers M.D.   On: 11/12/2015 08:27  Decreased aeration bilaterally; elevated R HD (chronic)  ASSESSMENT / PLAN:  PULMONARY OETT placed 11/11>>11/13 A: Respiratory failure ? Aspiration pneumonia  Chronically elevated Right HD Suspected ongoing aspiration P:   -Pt made DNI 11/14  - Antibiotics in ID section - Continuous pulse ox -[place] O2 as needed   CARDIOVASCULAR H/o Hypertension  EKG 11/11: sinus with LBBB, stable from previous Trop neg A:  P:  - Telemetry - IVFs KVO   RENAL A:   Mildly hyponatremic-->resolved  Hypophosphatemia--> resolved Hypomagnesemia -->resolved   P:   - Monitor BMET - Replete electrolytes as needed - Foley care  GASTROINTESTINAL A:   H/o GERD P:   - Stress dose Pepcid  HEMATOLOGIC A:  Anemia of chronic disease.   No evidence of bleeding. Hgb Stable P:  - Monitor CBC - Cont [place] heparin   INFECTIOUS A:   Sepsis(resolved) ? Aspiration pneumonia  Hypothermia resolved P:   RVP (11/11)>Rhinovirus BCx2 (11/11)>> NG x3d UC (11/11)>> insignificant growth <2k colonies Aspirate cx (11/11)>>neg Legionella Ag (11/11)>>neg Urine Strep Ag (11/11) >>neg  Abx:   - Vanc 11/11>>11/14 - Zosyn 11/11>>11/14 - Levaquin 11/11>>11/14 - Unasyn 11/14>>>  - PCT protocol noted - Monitor temp.  - CBC in am  ENDOCRINE A:   No h/o endocrine d/o   P:   - Monitor CBGs - Avoid  hypoglycemia  NEUROLOGIC A:   Parkinson's disease on Carbidopa/Levodopa at home P:   - RASS goal: 0 - Restarted parkinson's meds vt    FAMILY  - Updates: 11/14 with Daughter and caregiver at bedside. Pt made FULL DNR/DNI. No feeding tube. Continue IVFs/atbx  - Updated 11/17 daughter plans to take her home and she is a full code. Daughter declines palliative care consult.  - Inter-disciplinary family meet or Palliative Care meeting due by:  11/18. Family declines consult.  Today's summary: 19 chronically ill female with history of Parkinson's dementia, hypertension, GERD, and anemia recently intubated for aspiration pna on 11/11>>extubated on 11/13. Pt with deteriorating resp status on 11/14 and inability to protect airway. Discussion with family 11/14 indicated Full DNR/DNI, no feeding tube. Would like to continue IVF/atbx. Pt with increased responsiveness 11/15. Transfer to floor for continued care.  Consider palliation; discussed with family 11/15. Daughter is reluctant to begin palliation/hospice due to previous "bad experience" with hospice. Wants to discuss with son before making final decision for palliation.  11/17 PCCM reconsulted as family has reversed code status to full code. Plan is to take her home per daughter request. She is not eating well on nectar thick diet. Speech therapy has warned family of ongoing aspiration risks in minimally responsive neuromuscular disease pt.  Suspect she is aspirating her own secretions as she coughs and chokes when in supine position.  She will most likely have respiratory compromise in the future from progressive neuromuscular disease that will result in intubation.  Shawna Torres Shawna Torres ACNP [facility] Pager [phone] till 3 pm If no answer page [phone] 11/13/2015, 7:57 AM

## 2015-11-13 NOTE — Progress Notes (Signed)
Patient Demographics:    Shawna Torres, is a 81 y.o. female, DOB - 1932/04/09, YNW:[id number]  Admit date - 11/07/2015   Admitting Physician Roslynn Amble, MD  Outpatient Primary MD for the patient is Pamelia Hoit, MD  LOS - 6  Summary  Shawna Torres is a 81 y.o. that presented to ED via EMS after she was found to be hypoxemic at her PCP's office. She required emergent intubation once she arrived to ED after vomiting. There was concern for aspiration. Of note, patient hospitalized in 2012, at which time she was intubated. PMH significant for Parkinson's disease, depression, GERD, HTN. Upon my evaluation, patient saturating 100% on vent. She was hypothermic to 51 F and clinically had sepsis.  She was intubated and kept in ICU by pulmonary critical care, she was treated with antibiotics, subsequently extubated antibiotics tapered down and transferred to hospitalist service under my care on 11/12/2015. She continues to have a very poor cough reflex, has initially failed speech evaluation, daughter who is the primary decision maker has currently made her DO NOT RESUSCITATE but she refuses palliative care at this time despite extensive counseling by me.   Chief Complaint  Patient presents with  . Altered Mental Status        Subjective:    Shawna Torres today has, No headache, No chest pain, No abdominal pain - No Nausea, No new weakness tingling or numbness, mild cough but no shortness of breath.   Assessment  & Plan :    1. Acute on chronic hypoxic respiratory failure due to aspiration pneumonia causing toxic encephalopathy. Initially required intubation and admission to ICU by pulmonary critical care, treated with appropriate antibiotics currently on Unasyn and stop date 11/13/2015. Cultures  suggestive of rhinovirus but no bacterial agents are positive thus far. Continue oxygen and supportive care, speech therapy following. Once Unasyn has stopped after today we will monitor clinically likely will very deteriorate fast.  Remains to have a very poor cough reflex and appears to be extremely frail and cachectic which increases her future chances of aspiration and respiratory failure. Extremely poor and he did for CPR and future intubation, however despite extensive counseling by me and pulmonary critical care daughter who is the primary decision maker is insisting on keeping the patient full code. She had earlier wanted her to be DO NOT INTUBATE but now has reversed her decision. Also refuses to talk to palliative care. She has been clearly explained that patient will likely aspirate and code in the near future.  Remains very high risk for aspiration, speech therapy following, I suspect she is aspirating her oral secretions as well. Long-term prognosis remains very poor.    2. History of hypertension and chronic left bundle branch block. Placed on Coreg & hydralazine, also added as needed IV hydralazine.   3. History of Parkinson's disease, bedbound status with generalized weakness and cachexia. Continue carbidopa levodopa combination along with supportive care.   4. Anemia of chronic disease. Stable monitor.   5. GERD. PPI IV for now as nothing by mouth.    Code Status : Full  Family Communication  : Daughter bedside, clearly described extremely poor prognosis, advised Pall care which she refused.  After D/W Nursing staff they have  now switched from DNI to Full code.  Disposition Plan  : Home 1-2 days  Consults  :  Pulmonary critical care, speech, patient's daughter primary decision maker refused palliative care  Procedures  :   11/11: Intubated, admitted to ICU 11/12 decreased sedation 11/13 Extubated 11/14 Made DNR/DNI  CT head no acute abnormality.  Echo in 2015  grade 1 diastolic dysfunction with EF of 50%.   DVT Prophylaxis  :    Heparin    Lab Results  Component Value Date   PLT 120* 11/11/2015    Inpatient Medications  Scheduled Meds: . ampicillin-sulbactam (UNASYN) IV  3 g Intravenous Q6H  . antiseptic oral rinse  7 mL Mouth Rinse QID  . carbidopa-levodopa  1 tablet Per Tube BID  . carvedilol  3.125 mg Oral BID WC  . chlorhexidine gluconate  15 mL Mouth Rinse BID  . heparin  5,000 Units Subcutaneous 3 times per day  . hydrALAZINE  50 mg Oral 3 times per day  . insulin aspart  0-9 Units Subcutaneous 6 times per day  . pantoprazole (PROTONIX) IV  40 mg Intravenous Q24H   Continuous Infusions: . dextrose 50 mL/hr at 11/12/15 1501   PRN Meds:.  Antibiotics  :     Anti-infectives    Start     Dose/Rate Route Frequency Ordered Stop   11/10/15 1600  Ampicillin-Sulbactam (UNASYN) 3 g in sodium chloride 0.9 % 100 mL IVPB     3 g 100 mL/hr over 60 Minutes Intravenous Every 6 hours 11/10/15 1147 11/13/15 2359   11/08/15 1800  vancomycin (VANCOCIN) IVPB 750 mg/150 ml premix  Status:  Discontinued     750 mg 150 mL/hr over 60 Minutes Intravenous Every 24 hours 11/07/15 2031 11/10/15 1143   11/08/15 0000  piperacillin-tazobactam (ZOSYN) IVPB 3.375 g  Status:  Discontinued     3.375 g 12.5 mL/hr over 240 Minutes Intravenous Every 8 hours 11/07/15 2030 11/07/15 2032   11/08/15 0000  piperacillin-tazobactam (ZOSYN) IVPB 3.375 g  Status:  Discontinued     3.375 g 12.5 mL/hr over 240 Minutes Intravenous Every 8 hours 11/07/15 2032 11/10/15 1143   11/07/15 2200  levofloxacin (LEVAQUIN) IVPB 750 mg  Status:  Discontinued     750 mg 100 mL/hr over 90 Minutes Intravenous Every 24 hours 11/07/15 2118 11/07/15 2132   11/07/15 2145  doxycycline (VIBRAMYCIN) 100 mg in dextrose 5 % 250 mL IVPB  Status:  Discontinued     100 mg 125 mL/hr over 120 Minutes Intravenous Every 12 hours 11/07/15 2132 11/10/15 1143   11/07/15 2115  levofloxacin (LEVAQUIN)  tablet 750 mg  Status:  Discontinued     750 mg Oral Daily 11/07/15 2101 11/07/15 2118   11/07/15 1745  vancomycin (VANCOCIN) IVPB 1000 mg/200 mL premix     1,000 mg 200 mL/hr over 60 Minutes Intravenous  Once 11/07/15 1734 11/07/15 1902   11/07/15 1745  piperacillin-tazobactam (ZOSYN) IVPB 3.375 g     3.375 g 100 mL/hr over 30 Minutes Intravenous  Once 11/07/15 1734 11/07/15 1829        Objective:   Filed Vitals:   11/12/15 1308 11/12/15 2250 11/13/15 0500 11/13/15 0701  BP: 181/77 158/56 183/72 146/63  Pulse: 57 63 70 78  Temp: 97.6 F (36.4 C) 97.7 F (36.5 C) 98.5 F (36.9 C)   TempSrc: Oral Oral Oral   Resp: Height:      Weight:   42.457 kg (93 lb  9.6 oz)   SpO2: 100% 96% 99% 98%    Wt Readings from Last 3 Encounters:  11/13/15 42.457 kg (93 lb 9.6 oz)  07/27/14 47.945 kg (105 lb 11.2 oz)     Intake/Output Summary (Last 24 hours) at 11/13/15 1015 Last data filed at 11/13/15 0600  Gross per 24 hour  Intake 949.17 ml  Output      0 ml  Net 949.17 ml     Physical Exam  Frail elderly cachectic Caucasian female lying in hospital bed appears to have chronic contractures and severe deconditioning, with No new F.N deficits, flat affect [place].AT,PERRAL Supple Neck,No JVD, No cervical lymphadenopathy appriciated.  Symmetrical Chest wall movement, Good air movement bilaterally, coarse bilateral breath sounds RRR,No Gallops,Rubs or new Murmurs, No Parasternal Heave +ve B.Sounds, Abd Soft, No tenderness, No organomegaly appriciated, No rebound - guarding or rigidity. No Cyanosis, Clubbing or edema, No new Rash or bruise       Data Review:   Micro Results Recent Results (from the past 240 hour(s))  Blood Culture (routine x 2)     Status: None   Collection Time: 11/07/15  5:45 PM  Result Value Ref Range Status   Specimen Description BLOOD LEFT HAND  Final   Special Requests BOTTLES DRAWN AEROBIC AND ANAEROBIC 5CC  Final   Culture NO GROWTH 5 DAYS  Final    Report Status 11/12/2015 FINAL  Final  Blood Culture (routine x 2)     Status: None   Collection Time: 11/07/15  5:52 PM  Result Value Ref Range Status   Specimen Description BLOOD RIGHT HAND  Final   Special Requests BOTTLES DRAWN AEROBIC AND ANAEROBIC 5CC  Final   Culture NO GROWTH 5 DAYS  Final   Report Status 11/12/2015 FINAL  Final  Urine culture     Status: None   Collection Time: 11/07/15  5:56 PM  Result Value Ref Range Status   Specimen Description URINE, CATHETERIZED  Final   Special Requests NONE  Final   Culture 2,000 COLONIES/mL INSIGNIFICANT GROWTH  Final   Report Status 11/08/2015 FINAL  Final  MRSA PCR Screening     Status: Abnormal   Collection Time: 11/07/15  9:31 PM  Result Value Ref Range Status   MRSA by PCR INVALID RESULTS, SPECIMEN SENT FOR CULTURE (A) NEGATIVE Final    Comment:        The GeneXpert MRSA Assay (FDA approved for NASAL specimens only), is one component of a comprehensive MRSA colonization surveillance program. It is not intended to diagnose MRSA infection nor to guide or monitor treatment for MRSA infections. RESULT CALLED TO, READ BACK BY AND VERIFIED WITH: GREG @0113  11/08/15 MKELLY   MRSA culture     Status: None   Collection Time: 11/07/15  9:31 PM  Result Value Ref Range Status   Specimen Description NASAL SWAB  Final   Special Requests NONE  Final   Culture NOMRSA Performed at [facility]   Final   Report Status 11/10/2015 FINAL  Final  Culture, respiratory (NON-Expectorated)     Status: None   Collection Time: 11/07/15 10:38 PM  Result Value Ref Range Status   Specimen Description TRACHEAL ASPIRATE  Final   Special Requests NONE  Final   Gram Stain   Final    ABUNDANT WBC PRESENT,BOTH PMN AND MONONUCLEAR RARE SQUAMOUS EPITHELIAL CELLS PRESENT FEW GRAM POSITIVE COCCI IN PAIRS RARE GRAM NEGATIVE RODS Performed at [facility]  Final    Non-Pathogenic Oropharyngeal-type [place]ora  Isolated. Performed at [facility]    Report Status 11/10/2015 FINAL  Final  Respiratory virus panel     Status: Abnormal   Collection Time: 11/07/15 10:56 PM  Result Value Ref Range Status   Respiratory Syncytial Virus A Negative Negative Final   Respiratory Syncytial Virus B Negative Negative Final   Influenza A Negative Negative Final   Influenza B Negative Negative Final   Parainfluenza 1 Negative Negative Final   Parainfluenza 2 Negative Negative Final   Parainfluenza 3 Negative Negative Final   Metapneumovirus Negative Negative Final   Rhinovirus Positive (A) Negative Final   Adenovirus Negative Negative Final    Comment: (NOTE) Performed At: [facility] [address] [place], [place] [id number] Mila Homer MD EX:[id number]     Radiology Reports   Ct Head Wo Contrast  11/07/2015  CLINICAL DATA:  Per family altered mental status and patient's mental status declined in transport. Pt vomited x 1 today. EXAM: CT HEAD WITHOUT CONTRAST TECHNIQUE: Contiguous axial images were obtained from the base of the skull through the vertex without intravenous contrast. COMPARISON:  07/27/2014 FINDINGS: There is significant central and cortical atrophy. Severe periventricular white matter changes are consistent with small vessel disease and appear chronic. There is no intra or extra-axial fluid collection or mass lesion. The basilar cisterns and ventricles have a normal appearance. There is no CT evidence for acute infarction or hemorrhage. IMPRESSION: No evidence for acute intracranial abnormality. Significant atrophy and small vessel disease. Electronically Signed   By: Norva Pavlov M.D.   On: 11/07/2015 18:29   Dg Chest Port 1 View  11/13/2015  CLINICAL DATA:  ? Aspiration Pneumonia; Patients head fixed to the right EXAM: PORTABLE CHEST 1 VIEW COMPARISON:  11/12/2015 FINDINGS: Heart size is obscured by bibasilar opacities. There are bilateral pleural effusions and  bilateral lower lobe atelectasis or infiltrates, stable in appearance. The patient's chin obscures or right lung apex. IMPRESSION: Little interval change in significant bilateral lower lobe opacities and effusions. Electronically Signed   By: Norva Pavlov M.D.   On: 11/13/2015 08:52   Dg Chest Port 1 View  11/12/2015  CLINICAL DATA:  Shortness of breath. EXAM: PORTABLE CHEST 1 VIEW COMPARISON:  11/09/2015, 11/07/2015 and 07/11/2015 FINDINGS: Endotracheal tube and NG tube have been removed. The patient has developed moderate bilateral pleural effusions. There is slight bilateral perihilar edema. No other change. IMPRESSION: Interval development of moderate bilateral pleural effusions and slight perihilar pulmonary edema. Electronically Signed   By: Francene Boyers M.D.   On: 11/12/2015 08:27   Dg Chest Port 1 View  11/09/2015  CLINICAL DATA:  81 year old female with pneumonia. EXAM: PORTABLE CHEST 1 VIEW COMPARISON:  11/07/2015 and prior chest radiographs FINDINGS: Cardiomediastinal silhouette is unchanged. Decreased left lung pulmonary vascular congestion noted. An endotracheal tube is identified with tip at the carina -recommend 2-3 cm retraction. Elevation of the right hemidiaphragm and bibasilar atelectasis/airspace disease again noted. There is no evidence of pneumothorax. An NG tube is identified with tip overlying the pre-pyloric region. IMPRESSION: Endotracheal tube with tip at the carina -recommend 2-3 cm retraction. Delsa Sale, nurse with this patient, was notified. Decreased left sided pulmonary vascular congestion. No other significant changes. Electronically Signed   By: Harmon Pier M.D.   On: 11/09/2015 07:37   Dg Chest Portable 1 View  11/07/2015  CLINICAL DATA:  Endotracheal tube placement. EXAM: PORTABLE CHEST 1 VIEW COMPARISON:  11/07/2015 and 07/11/2015 FINDINGS:  Patient is moderately rotated to the right. Endotracheal tube has tip approximately 1.6 cm above the carina along the anterior  tracheal wall. Nasogastric tube courses into the region of the stomach and off the inferior portion of the film. There is moderate elevation right hemidiaphragm unchanged. Hazy opacification of the left base likely combination of small effusion with atelectasis. There is prominence of the perihilar markings likely mild degree of vascular congestion. Remainder of the exam is unchanged. IMPRESSION: Findings suggesting mild vascular congestion. Left base opacification likely small effusion with associated atelectasis although cannot exclude infection. Moderate stable elevation of the right hemidiaphragm. Tubes and lines as described. Note that the endotracheal tube has tip approximately 1.6 cm above the carina. Electronically Signed   By: Elberta Fortisaniel  Boyle M.D.   On: 11/07/2015 19:19   Dg Chest Portable 1 View  11/07/2015  CLINICAL DATA:  Shortness of breath.  Concern for aspiration. EXAM: PORTABLE CHEST 1 VIEW COMPARISON:  07/11/2015 FINDINGS: There is opacity at the left lung base which obscures most of the left hemidiaphragm. This may be atelectasis. Pneumonia or aspiration pneumonitis is possible. There may be a small associated pleural effusion. Remainder of the lungs is clear. Right hemidiaphragm is elevated, stable from prior study. No pneumothorax. There cardiac silhouette is partly obscured. Is grossly normal in size. No mediastinal or hilar masses are evident. Bony thorax is demineralized. Intra medullary rod noted in the right humerus reducing an old, healed proximal shaft fracture. IMPRESSION: 1. Left lung base opacity which is consistent with pneumonia, aspiration pneumonitis, atelectasis or a combination. Possible associated small effusion. No other acute finding. Electronically Signed   By: Amie Portlandavid  Ormond M.D.   On: 11/07/2015 17:38     CBC  Recent Labs Lab 11/07/15 1715 11/07/15 1723 11/08/15 0240 11/09/15 0202 11/10/15 0211 11/11/15 0224  WBC 8.6  --  17.4* 7.8 7.4 6.8  HGB 9.2* 11.6* 7.9*  7.6* 8.0* 8.0*  HCT 30.0* 34.0* 25.3* 24.5* 26.0* 26.7*  PLT 159  --  162 137* 140* 120*  MCV 79.4  --  78.3 78.5 79.8 81.2  MCH 24.3*  --  24.5* 24.4* 24.5* 24.3*  MCHC 30.7  --  31.2 31.0 30.8 30.0  RDW 18.3*  --  18.7* 19.1* 19.3* 19.4*  LYMPHSABS 0.7  --  0.3*  --   --   --   MONOABS 0.3  --  0.6  --   --   --   EOSABS 0.1  --  0.0  --   --   --   BASOSABS 0.0  --  0.0  --   --   --     Chemistries   Recent Labs Lab 11/07/15 1715  11/08/15 0240 11/08/15 1320 11/08/15 2218 11/09/15 0202 11/09/15 1050 11/09/15 2243 11/10/15 0211 11/10/15 1113 11/10/15 2250 11/11/15 0224  NA 134*  < > 134* 135  --  137  --   --  140  --   --  142  K 5.0  < > 5.2* 4.6  --  4.1  --   --  4.1  --   --  4.0  CL 99*  < > 100* 103  --  103  --   --  106  --   --  106  CO2 26  --  28 27  --  28  --   --  29  --   --  30  GLUCOSE 211*  < > 139* 74  --  98  --   --  105*  --   --  105*  BUN 28*  < > 24* 23*  --  27*  --   --  24*  --   --  15  CREATININE 0.87  < > 0.75 0.93  --  0.91  --   --  0.78  --   --  0.80  CALCIUM 9.0  --  8.4* 8.0*  --  8.0*  --   --  8.2*  --   --  8.5*  MG  --   < > 1.4* 1.5* 2.9*  --  2.3 2.1  --  1.9 1.8  --   AST 48*  --  56*  --   --  40  --   --  30  --   --   --   ALT 9*  --  18  --   --  7*  --   --  19  --   --   --   ALKPHOS 92  --  83  --   --  72  --   --  70  --   --   --   BILITOT 0.3  --  0.7  --   --  0.6  --   --  0.5  --   --   --   < > = values in this interval not displayed. ------------------------------------------------------------------------------------------------------------------ estimated creatinine clearance is 37 mL/min (by C-G formula based on Cr of 0.8). ------------------------------------------------------------------------------------------------------------------ No results for input(s): HGBA1C in the last 72  hours. ------------------------------------------------------------------------------------------------------------------  Recent Labs  11/11/15 0224  TRIG 67   ------------------------------------------------------------------------------------------------------------------ No results for input(s): TSH, T4TOTAL, T3FREE, THYROIDAB in the last 72 hours.  Invalid input(s): FREET3 ------------------------------------------------------------------------------------------------------------------ No results for input(s): VITAMINB12, FOLATE, FERRITIN, TIBC, IRON, RETICCTPCT in the last 72 hours.  Coagulation profile No results for input(s): INR, PROTIME in the last 168 hours.  No results for input(s): DDIMER in the last 72 hours.  Cardiac Enzymes No results for input(s): CKMB, TROPONINI, MYOGLOBIN in the last 168 hours.  Invalid input(s): CK ------------------------------------------------------------------------------------------------------------------ Invalid input(s): POCBNP   Time Spent in minutes  35   SINGH,PRASHANT K M.D on 11/13/2015 at 10:15 AM  Between 7am to 7pm - Pager - [phone]  After 7pm go to [email or web address] - password [facility]  [facility] -  Office  [phone]

## 2015-11-13 NOTE — Progress Notes (Addendum)
Speech Language Pathology Treatment:  (family/caregiver education)  Patient Details Name: Shawna Torres MRN: [id number] DOB: 11/20/1932 Today's Date: 11/13/2015 Time: [id number] SLP Time Calculation (min) (ACUTE ONLY): 14 min  Assessment / Plan / Recommendation Clinical Impression  Attempted to observe with recommended po's and family education although pt sleeping soundly and SLP did not disturb. Family (son/daughter/caregiver) reported she did not eat dinner last night or breakfast; had several ice chips over course of evening. SLP provided additional education/discussion re: differences between hospice and Palliative care, benefits and purpose etc. Daughter pulled out a handout RN provided on Palliative services. Family appeared possibly open to idea and this therapist advised to notify RN who can assist if they decide to proceed. SLP educated family on thickener for liquids ,mixing directions and answered questions.    HPI HPI: 81 y.o. with PMH significant for Parkinson's disease, depression, GERD, HTN that presented to ED via EMS after she was found to be hypoxemic at her PCP's office. Intubated after vomitting in ED and concern for aspiration 11/11-11/13. CXR decreased left sided pulmonary vascular congestion. BSE 11/14 recommended continue NPO with MD considering re-intubation. Pt improved somewhat, transferred to floor and repeat BSE recommended. CXR 11/16 Interval development of moderate bilateral pleural effusions and slight perihilar pulmonary  Per MD note, family declining Palliative services.      SLP Plan  Continue with current plan of care (see one more session)     Recommendations  Diet recommendations: Dysphagia 1 (puree);Nectar-thick liquid Liquids provided via: Teaspoon Medication Administration: Crushed with puree Supervision: Trained caregiver to feed patient;Full supervision/cueing for compensatory strategies;Staff to assist with self feeding Compensations: Minimize  environmental distractions;Slow rate;Small sips/bites Postural Changes and/or Swallow Maneuvers: Seated upright 90 degrees              Oral Care Recommendations: Oral care QID Follow up Recommendations: None (Palliative care) Plan: Continue with current plan of care (see one more session)   Shawna Torres, Shawna Torres 11/13/2015, 12:05 PM Breck CoonsLisa Torres Lonell FaceLitaker M.Ed [employer]SLP Pager [phone]

## 2015-11-13 NOTE — Care Management Note (Addendum)
Case Management Note  Patient Details  Name: Shawna Torres MRN: [id number] Date of Birth: 02/19/1932  Subjective/Objective:                  Date-11-13-15 Initial Assessment Patient transferred from another unit 29M on 11-16 Spoke with patient at the bedside along with non relative caregiver Shawna Torres, and daughter Shawna Torres [phone] Introduced self as case Production designer, theatre/television/filmmanager and explained role in discharge planning and how to be reached.  Verified patient lives at [address] [place] [id number]  Verified patient anticipates to go home with family, at time of discharge and will have full- time supervision by family and caregiver at this time to best of their knowledge.  Patient has DME  wheelchair hoyer lift hospital bed. Expressed potential need for gel mattress for hospital bed.  Patient denied  needing help with their medication.  Patient is driven by daughter to MD appointments.  Verified patient has [facility]. Patient states they currently receive HH services through no one.   Plan: CM will continue to follow [facility] planning [facility].   Lawerance Sabalebbie Cherye Gaertner RN BSN CM [phone]   Action/Plan:  Spoke with daughter and son about disposition plan. Family has uncertainty about what hospice care would include for the patient. Asked Misty StanleyLisa RN with [facility] to speak to family to offer clarity about hospice as a possible resource at home. Misty StanleyLisa agreed to speak to family and family updated that she will be by this afternoon.  Patient's daughter Shawna Torres wishes to continue IVF etc after speaking with [facility]. [facility] home hospital bed. Expected Discharge Date:                  Expected Discharge Plan:  Home w Home Health Services  In-House Referral:     Discharge planning Services  CM Consult  Post Acute Care Choice:    Choice offered to:     DME Arranged:     DME Agency:     HH Arranged:    HH Agency:     Status of Service:  In process, will continue to follow  Medicare Important Message Given:  Yes Date Medicare IM Given:    Medicare IM give by:    Date Additional Medicare IM Given:    Additional Medicare Important Message give by:     If discussed at Long Length of Stay Meetings, dates discussed:    Additional Comments:  Lawerance SabalDebbie Joneric Streight, RN 11/13/2015, 11:09 AM

## 2015-11-14 LAB — BASIC METABOLIC PANEL
ANION GAP: 9 (ref 5–15)
BUN: 18 mg/dL (ref 6–20)
CO2: 31 mmol/L (ref 22–32)
Calcium: 8.6 mg/dL — ABNORMAL LOW (ref 8.9–10.3)
Chloride: 101 mmol/L (ref 101–111)
Creatinine, Ser: 0.95 mg/dL (ref 0.44–1.00)
GFR calc Af Amer: >60 mL/min (ref >60)
GFR, EST NON AFRICAN AMERICAN: 55 mL/min — AB (ref >60)
GLUCOSE: 97 mg/dL (ref 65–99)
POTASSIUM: 3.3 mmol/L — AB (ref 3.5–5.1)
SODIUM: 141 mmol/L (ref 135–145)

## 2015-11-14 LAB — GLUCOSE, CAPILLARY
GLUCOSE-CAPILLARY: 95 mg/dL (ref 65–99)
GLUCOSE-CAPILLARY: 102 mg/dL — AB (ref 65–99)
GLUCOSE-CAPILLARY: 85 mg/dL (ref 65–99)
GLUCOSE-CAPILLARY: 78 mg/dL (ref 65–99)
Glucose-Capillary: 100 mg/dL — ABNORMAL HIGH (ref 65–99)

## 2015-11-14 LAB — CBC
HCT: 28.5 % — ABNORMAL LOW (ref 36.0–46.0)
Hemoglobin: 8.8 g/dL — ABNORMAL LOW (ref 12.0–15.0)
MCH: 25 pg — ABNORMAL LOW (ref 26.0–34.0)
MCHC: 30.9 g/dL (ref 30.0–36.0)
MCV: 81 fL (ref 78.0–100.0)
PLATELETS: 197 10*3/uL (ref 150–400)
RBC: 3.52 MIL/uL — AB (ref 3.87–5.11)
RDW: 20.6 % — ABNORMAL HIGH (ref 11.5–15.5)
WBC: 9.4 10*3/uL (ref 4.0–10.5)

## 2015-11-14 MED ORDER — HALOPERIDOL LACTATE 5 MG/ML IJ SOLN: 0.5000 mg | INTRAMUSCULAR | Status: DC | PRN

## 2015-11-14 MED ORDER — MORPHINE SULFATE (PF) 2 MG/ML IV SOLN
2.0000 mg | INTRAVENOUS | Status: DC | PRN
  Administered 2015-11-14 – 2015-11-16 (×4): 2 mg via INTRAVENOUS
  Filled 2015-11-14 (×5): qty 1

## 2015-11-14 MED ORDER — POLYVINYL ALCOHOL 1.4 % OP SOLN
1.0000 [drp] | Freq: Four times a day (QID) | OPHTHALMIC | Status: DC | PRN
  Filled 2015-11-14: qty 15

## 2015-11-14 MED ORDER — GLYCOPYRROLATE 0.2 MG/ML IJ SOLN
0.2000 mg | INTRAMUSCULAR | Status: DC | PRN
  Filled 2015-11-14: qty 1

## 2015-11-14 MED ORDER — ACETAMINOPHEN 325 MG PO TABS: 650.0000 mg | ORAL_TABLET | Freq: Four times a day (QID) | ORAL | Status: DC | PRN

## 2015-11-14 MED ORDER — ONDANSETRON 4 MG PO TBDP
4.0000 mg | ORAL_TABLET | Freq: Four times a day (QID) | ORAL | Status: DC | PRN
  Filled 2015-11-14: qty 1

## 2015-11-14 MED ORDER — POTASSIUM CHLORIDE 10 MEQ/100ML IV SOLN
10.0000 meq | INTRAVENOUS | Status: DC
  Administered 2015-11-14 (×2): 10 meq via INTRAVENOUS
  Filled 2015-11-14 (×2): qty 100

## 2015-11-14 MED ORDER — HALOPERIDOL LACTATE 2 MG/ML PO CONC
0.5000 mg | ORAL | Status: DC | PRN
  Filled 2015-11-14: qty 0.3

## 2015-11-14 MED ORDER — GLYCOPYRROLATE 1 MG PO TABS
1.0000 mg | ORAL_TABLET | ORAL | Status: DC | PRN
  Filled 2015-11-14: qty 1

## 2015-11-14 MED ORDER — BIOTENE DRY MOUTH MT LIQD: 15.0000 mL | OROMUCOSAL | Status: DC | PRN

## 2015-11-14 MED ORDER — BISACODYL 10 MG RE SUPP: 10.0000 mg | Freq: Every day | RECTAL | Status: DC | PRN

## 2015-11-14 MED ORDER — ONDANSETRON HCL 4 MG/2ML IJ SOLN: 4.0000 mg | Freq: Four times a day (QID) | INTRAMUSCULAR | Status: DC | PRN

## 2015-11-14 MED ORDER — ACETAMINOPHEN 650 MG RE SUPP: 650.0000 mg | Freq: Four times a day (QID) | RECTAL | Status: DC | PRN

## 2015-11-14 MED ORDER — HALOPERIDOL 0.5 MG PO TABS
0.5000 mg | ORAL_TABLET | ORAL | Status: DC | PRN
  Filled 2015-11-14: qty 1

## 2015-11-14 MED ORDER — FUROSEMIDE 10 MG/ML IJ SOLN
20.0000 mg | Freq: Once | INTRAMUSCULAR | Status: AC
  Administered 2015-11-14: 20 mg via INTRAVENOUS
  Filled 2015-11-14: qty 2

## 2015-11-14 MED ORDER — KCL-LACTATED RINGERS-D5W 20 MEQ/L IV SOLN
INTRAVENOUS | Status: DC
  Administered 2015-11-14: 1 mL via INTRAVENOUS
  Filled 2015-11-14: qty 1000

## 2015-11-14 NOTE — Progress Notes (Signed)
Patient Demographics:    Shawna Torres, is a 81 y.o. female, DOB - 01-Jul-1932, MWU:[id number]  Admit date - 11/07/2015   Admitting Physician Roslynn Amble, MD  Outpatient Primary MD for the patient is Pamelia Hoit, MD  LOS - 7  Summary  Shawna Torres is a 81 y.o. that presented to ED via EMS after she was found to be hypoxemic at her PCP's office. She required emergent intubation once she arrived to ED after vomiting. There was concern for aspiration. Of note, patient hospitalized in 2012, at which time she was intubated. PMH significant for Parkinson's disease, depression, GERD, HTN. Upon my evaluation, patient saturating 100% on vent. She was hypothermic to 29 F and clinically had sepsis.  She was intubated and kept in ICU by pulmonary critical care, she was treated with antibiotics, subsequently extubated antibiotics tapered down and transferred to hospitalist service under my care on 11/12/2015. She continues to have a very poor cough reflex, has initially failed speech evaluation, daughter who is the primary decision maker has currently made her DO NOT RESUSCITATE but she refuses palliative care at this time despite extensive counseling by me.   Chief Complaint  Patient presents with  . Altered Mental Status        Subjective:    Shawna Torres today has, No headache, No chest pain, No abdominal pain - No Nausea, No new weakness tingling or numbness, mild cough but no shortness of breath. Appears more tired and fatigued.   Assessment  & Plan :    1. Acute on chronic hypoxic respiratory failure due to aspiration pneumonia causing toxic encephalopathy. Initially required intubation and admission to ICU by pulmonary critical care, treated with appropriate antibiotics currently on Unasyn and stop  date 11/13/2015. Cultures suggestive of rhinovirus but no bacterial agents are positive thus far. Continue oxygen and supportive care, speech therapy following. Once Unasyn has stopped after today we will monitor clinically likely will very deteriorate fast.  Remains to have a very poor cough reflex and appears to be extremely frail and cachectic which increases her future chances of aspiration and respiratory failure. Extremely poor and he did for CPR and future intubation, however despite extensive counseling by me and pulmonary critical care daughter who is the primary decision maker is insisting on keeping the patient full code. She had earlier wanted her to be DO NOT INTUBATE but now has reversed her decision. Also refuses to talk to palliative care. She has been clearly explained that patient will likely aspirate and code in the near future.  Remains very high risk for aspiration, speech therapy following, I suspect she is aspirating her oral secretions as well. Long-term prognosis remains very poor.  I had further discussions with patient's daughter and her son bedside, they will reconsider palliative care option and make a decision on PEG tube placement sewn. They are now thinking somewhat on comfort care.    2. History of hypertension and chronic left bundle branch block. Placed on Coreg & hydralazine, also added as needed IV hydralazine.   3. History of Parkinson's disease, bedbound status with generalized weakness and cachexia. Continue carbidopa levodopa combination along with supportive care.   4. Anemia of chronic disease. Stable monitor.   5. GERD.  PPI IV for now as nothing by mouth.    Code Status : Full  Family Communication  : Daughter bedside, clearly described extremely poor prognosis, advised Pall care which she refused.  After D/W Nursing staff they have now switched from DNI to Full code.  Disposition Plan  : Home 1-2 days  Consults  :  Pulmonary critical care,  speech, patient's daughter primary decision maker refused palliative care  Procedures  :   11/11: Intubated, admitted to ICU 11/12 decreased sedation 11/13 Extubated 11/14 Made DNR/DNI  CT head no acute abnormality.  Echo in 2015 grade 1 diastolic dysfunction with EF of 50%.   DVT Prophylaxis  :    Heparin    Lab Results  Component Value Date   PLT 197 11/14/2015    Inpatient Medications  Scheduled Meds: . antiseptic oral rinse  7 mL Mouth Rinse QID  . carbidopa-levodopa  1 tablet Per Tube BID  . carvedilol  3.125 mg Oral BID WC  . chlorhexidine gluconate  15 mL Mouth Rinse BID  . heparin  5,000 Units Subcutaneous 3 times per day  . hydrALAZINE  50 mg Oral 3 times per day  . insulin aspart  0-9 Units Subcutaneous 6 times per day  . pantoprazole (PROTONIX) IV  40 mg Intravenous Q24H   Continuous Infusions: . dextrose 50 mL/hr at 11/13/15 1722   PRN Meds:.  Antibiotics  :     Anti-infectives    Start     Dose/Rate Route Frequency Ordered Stop   11/10/15 1600  Ampicillin-Sulbactam (UNASYN) 3 g in sodium chloride 0.9 % 100 mL IVPB     3 g 100 mL/hr over 60 Minutes Intravenous Every 6 hours 11/10/15 1147 11/13/15 2244   11/08/15 1800  vancomycin (VANCOCIN) IVPB 750 mg/150 ml premix  Status:  Discontinued     750 mg 150 mL/hr over 60 Minutes Intravenous Every 24 hours 11/07/15 2031 11/10/15 1143   11/08/15 0000  piperacillin-tazobactam (ZOSYN) IVPB 3.375 g  Status:  Discontinued     3.375 g 12.5 mL/hr over 240 Minutes Intravenous Every 8 hours 11/07/15 2030 11/07/15 2032   11/08/15 0000  piperacillin-tazobactam (ZOSYN) IVPB 3.375 g  Status:  Discontinued     3.375 g 12.5 mL/hr over 240 Minutes Intravenous Every 8 hours 11/07/15 2032 11/10/15 1143   11/07/15 2200  levofloxacin (LEVAQUIN) IVPB 750 mg  Status:  Discontinued     750 mg 100 mL/hr over 90 Minutes Intravenous Every 24 hours 11/07/15 2118 11/07/15 2132   11/07/15 2145  doxycycline (VIBRAMYCIN) 100 mg in  dextrose 5 % 250 mL IVPB  Status:  Discontinued     100 mg 125 mL/hr over 120 Minutes Intravenous Every 12 hours 11/07/15 2132 11/10/15 1143   11/07/15 2115  levofloxacin (LEVAQUIN) tablet 750 mg  Status:  Discontinued     750 mg Oral Daily 11/07/15 2101 11/07/15 2118   11/07/15 1745  vancomycin (VANCOCIN) IVPB 1000 mg/200 mL premix     1,000 mg 200 mL/hr over 60 Minutes Intravenous  Once 11/07/15 1734 11/07/15 1902   11/07/15 1745  piperacillin-tazobactam (ZOSYN) IVPB 3.375 g     3.375 g 100 mL/hr over 30 Minutes Intravenous  Once 11/07/15 1734 11/07/15 1829        Objective:   Filed Vitals:   11/14/15 0031 11/14/15 0653 11/14/15 0706 11/14/15 0725  BP: 131/57  159/63   Pulse:  62 64   Temp:  98.7 F (37.1 C)    TempSrc:  Resp:  16    Height:      Weight:    43 kg (94 lb 12.8 oz)  SpO2:  100%      Wt Readings from Last 3 Encounters:  11/14/15 43 kg (94 lb 12.8 oz)  07/27/14 47.945 kg (105 lb 11.2 oz)     Intake/Output Summary (Last 24 hours) at 11/14/15 1058 Last data filed at 11/14/15 0600  Gross per 24 hour  Intake    550 ml  Output      0 ml  Net    550 ml     Physical Exam  Frail elderly cachectic Caucasian female lying in hospital bed appears to have chronic contractures and severe deconditioning, with No new F.N deficits, flat affect [place].AT,PERRAL Supple Neck,No JVD, No cervical lymphadenopathy appriciated.  Symmetrical Chest wall movement, Good air movement bilaterally, coarse bilateral breath sounds RRR,No Gallops,Rubs or new Murmurs, No Parasternal Heave +ve B.Sounds, Abd Soft, No tenderness, No organomegaly appriciated, No rebound - guarding or rigidity. No Cyanosis, Clubbing or edema, No new Rash or bruise       Data Review:   Micro Results Recent Results (from the past 240 hour(s))  Blood Culture (routine x 2)     Status: None   Collection Time: 11/07/15  5:45 PM  Result Value Ref Range Status   Specimen Description BLOOD LEFT HAND  Final    Special Requests BOTTLES DRAWN AEROBIC AND ANAEROBIC 5CC  Final   Culture NO GROWTH 5 DAYS  Final   Report Status 11/12/2015 FINAL  Final  Blood Culture (routine x 2)     Status: None   Collection Time: 11/07/15  5:52 PM  Result Value Ref Range Status   Specimen Description BLOOD RIGHT HAND  Final   Special Requests BOTTLES DRAWN AEROBIC AND ANAEROBIC 5CC  Final   Culture NO GROWTH 5 DAYS  Final   Report Status 11/12/2015 FINAL  Final  Urine culture     Status: None   Collection Time: 11/07/15  5:56 PM  Result Value Ref Range Status   Specimen Description URINE, CATHETERIZED  Final   Special Requests NONE  Final   Culture 2,000 COLONIES/mL INSIGNIFICANT GROWTH  Final   Report Status 11/08/2015 FINAL  Final  MRSA PCR Screening     Status: Abnormal   Collection Time: 11/07/15  9:31 PM  Result Value Ref Range Status   MRSA by PCR INVALID RESULTS, SPECIMEN SENT FOR CULTURE (A) NEGATIVE Final    Comment:        The GeneXpert MRSA Assay (FDA approved for NASAL specimens only), is one component of a comprehensive MRSA colonization surveillance program. It is not intended to diagnose MRSA infection nor to guide or monitor treatment for MRSA infections. RESULT CALLED TO, READ BACK BY AND VERIFIED WITH: GREG @0113  11/08/15 MKELLY   MRSA culture     Status: None   Collection Time: 11/07/15  9:31 PM  Result Value Ref Range Status   Specimen Description NASAL SWAB  Final   Special Requests NONE  Final   Culture NOMRSA Performed at [facility]   Final   Report Status 11/10/2015 FINAL  Final  Culture, respiratory (NON-Expectorated)     Status: None   Collection Time: 11/07/15 10:38 PM  Result Value Ref Range Status   Specimen Description TRACHEAL ASPIRATE  Final   Special Requests NONE  Final   Gram Stain   Final    ABUNDANT WBC PRESENT,BOTH PMN AND MONONUCLEAR RARE  SQUAMOUS EPITHELIAL CELLS PRESENT FEW GRAM POSITIVE COCCI IN PAIRS RARE GRAM NEGATIVE RODS Performed  at [facility]    Culture   Final    Non-Pathogenic Oropharyngeal-type Flora Isolated. Performed at [facility]    Report Status 11/10/2015 FINAL  Final  Respiratory virus panel     Status: Abnormal   Collection Time: 11/07/15 10:56 PM  Result Value Ref Range Status   Respiratory Syncytial Virus A Negative Negative Final   Respiratory Syncytial Virus B Negative Negative Final   Influenza A Negative Negative Final   Influenza B Negative Negative Final   Parainfluenza 1 Negative Negative Final   Parainfluenza 2 Negative Negative Final   Parainfluenza 3 Negative Negative Final   Metapneumovirus Negative Negative Final   Rhinovirus Positive (A) Negative Final   Adenovirus Negative Negative Final    Comment: (NOTE) Performed At: [facility] [address] [place], [place] [id number] Mila Homer MD ON:[id number]     Radiology Reports   Ct Head Wo Contrast  11/07/2015  CLINICAL DATA:  Per family altered mental status and patient's mental status declined in transport. Pt vomited x 1 today. EXAM: CT HEAD WITHOUT CONTRAST TECHNIQUE: Contiguous axial images were obtained from the base of the skull through the vertex without intravenous contrast. COMPARISON:  07/27/2014 FINDINGS: There is significant central and cortical atrophy. Severe periventricular white matter changes are consistent with small vessel disease and appear chronic. There is no intra or extra-axial fluid collection or mass lesion. The basilar cisterns and ventricles have a normal appearance. There is no CT evidence for acute infarction or hemorrhage. IMPRESSION: No evidence for acute intracranial abnormality. Significant atrophy and small vessel disease. Electronically Signed   By: Norva Pavlov M.D.   On: 11/07/2015 18:29   Dg Chest Port 1 View  11/13/2015  CLINICAL DATA:  ? Aspiration Pneumonia; Patients head fixed to the right EXAM: PORTABLE CHEST 1 VIEW COMPARISON:  11/12/2015 FINDINGS:  Heart size is obscured by bibasilar opacities. There are bilateral pleural effusions and bilateral lower lobe atelectasis or infiltrates, stable in appearance. The patient's chin obscures or right lung apex. IMPRESSION: Little interval change in significant bilateral lower lobe opacities and effusions. Electronically Signed   By: Norva Pavlov M.D.   On: 11/13/2015 08:52   Dg Chest Port 1 View  11/12/2015  CLINICAL DATA:  Shortness of breath. EXAM: PORTABLE CHEST 1 VIEW COMPARISON:  11/09/2015, 11/07/2015 and 07/11/2015 FINDINGS: Endotracheal tube and NG tube have been removed. The patient has developed moderate bilateral pleural effusions. There is slight bilateral perihilar edema. No other change. IMPRESSION: Interval development of moderate bilateral pleural effusions and slight perihilar pulmonary edema. Electronically Signed   By: Francene Boyers M.D.   On: 11/12/2015 08:27   Dg Chest Port 1 View  11/09/2015  CLINICAL DATA:  81 year old female with pneumonia. EXAM: PORTABLE CHEST 1 VIEW COMPARISON:  11/07/2015 and prior chest radiographs FINDINGS: Cardiomediastinal silhouette is unchanged. Decreased left lung pulmonary vascular congestion noted. An endotracheal tube is identified with tip at the carina -recommend 2-3 cm retraction. Elevation of the right hemidiaphragm and bibasilar atelectasis/airspace disease again noted. There is no evidence of pneumothorax. An NG tube is identified with tip overlying the pre-pyloric region. IMPRESSION: Endotracheal tube with tip at the carina -recommend 2-3 cm retraction. Delsa Sale, nurse with this patient, was notified. Decreased left sided pulmonary vascular congestion. No other significant changes. Electronically Signed   By: Harmon Pier M.D.   On: 11/09/2015 07:37  Dg Chest Portable 1 View  11/07/2015  CLINICAL DATA:  Endotracheal tube placement. EXAM: PORTABLE CHEST 1 VIEW COMPARISON:  11/07/2015 and 07/11/2015 FINDINGS: Patient is moderately rotated to the  right. Endotracheal tube has tip approximately 1.6 cm above the carina along the anterior tracheal wall. Nasogastric tube courses into the region of the stomach and off the inferior portion of the film. There is moderate elevation right hemidiaphragm unchanged. Hazy opacification of the left base likely combination of small effusion with atelectasis. There is prominence of the perihilar markings likely mild degree of vascular congestion. Remainder of the exam is unchanged. IMPRESSION: Findings suggesting mild vascular congestion. Left base opacification likely small effusion with associated atelectasis although cannot exclude infection. Moderate stable elevation of the right hemidiaphragm. Tubes and lines as described. Note that the endotracheal tube has tip approximately 1.6 cm above the carina. Electronically Signed   By: Elberta Fortisaniel  Boyle M.D.   On: 11/07/2015 19:19   Dg Chest Portable 1 View  11/07/2015  CLINICAL DATA:  Shortness of breath.  Concern for aspiration. EXAM: PORTABLE CHEST 1 VIEW COMPARISON:  07/11/2015 FINDINGS: There is opacity at the left lung base which obscures most of the left hemidiaphragm. This may be atelectasis. Pneumonia or aspiration pneumonitis is possible. There may be a small associated pleural effusion. Remainder of the lungs is clear. Right hemidiaphragm is elevated, stable from prior study. No pneumothorax. There cardiac silhouette is partly obscured. Is grossly normal in size. No mediastinal or hilar masses are evident. Bony thorax is demineralized. Intra medullary rod noted in the right humerus reducing an old, healed proximal shaft fracture. IMPRESSION: 1. Left lung base opacity which is consistent with pneumonia, aspiration pneumonitis, atelectasis or a combination. Possible associated small effusion. No other acute finding. Electronically Signed   By: Amie Portlandavid  Ormond M.D.   On: 11/07/2015 17:38     CBC  Recent Labs Lab 11/07/15 1715  11/08/15 0240 11/09/15 0202  11/10/15 0211 11/11/15 0224 11/14/15 0705  WBC 8.6  --  17.4* 7.8 7.4 6.8 9.4  HGB 9.2*  < > 7.9* 7.6* 8.0* 8.0* 8.8*  HCT 30.0*  < > 25.3* 24.5* 26.0* 26.7* 28.5*  PLT 159  --  162 137* 140* 120* 197  MCV 79.4  --  78.3 78.5 79.8 81.2 81.0  MCH 24.3*  --  24.5* 24.4* 24.5* 24.3* 25.0*  MCHC 30.7  --  31.2 31.0 30.8 30.0 30.9  RDW 18.3*  --  18.7* 19.1* 19.3* 19.4* 20.6*  LYMPHSABS 0.7  --  0.3*  --   --   --   --   MONOABS 0.3  --  0.6  --   --   --   --   EOSABS 0.1  --  0.0  --   --   --   --   BASOSABS 0.0  --  0.0  --   --   --   --   < > = values in this interval not displayed.  Chemistries   Recent Labs Lab 11/07/15 1715  11/08/15 0240 11/08/15 1320 11/08/15 2218 11/09/15 0202 11/09/15 1050 11/09/15 2243 11/10/15 0211 11/10/15 1113 11/10/15 2250 11/11/15 0224 11/14/15 0705  NA 134*  < > 134* 135  --  137  --   --  140  --   --  142 141  K 5.0  < > 5.2* 4.6  --  4.1  --   --  4.1  --   --  4.0 3.3*  CL 99*  < > 100* 103  --  103  --   --  106  --   --  106 101  CO2 26  --  28 27  --  28  --   --  29  --   --  30 31  GLUCOSE 211*  < > 139* 74  --  98  --   --  105*  --   --  105* 97  BUN 28*  < > 24* 23*  --  27*  --   --  24*  --   --  15 18  CREATININE 0.87  < > 0.75 0.93  --  0.91  --   --  0.78  --   --  0.80 0.95  CALCIUM 9.0  --  8.4* 8.0*  --  8.0*  --   --  8.2*  --   --  8.5* 8.6*  MG  --   < > 1.4* 1.5* 2.9*  --  2.3 2.1  --  1.9 1.8  --   --   AST 48*  --  56*  --   --  40  --   --  30  --   --   --   --   ALT 9*  --  18  --   --  7*  --   --  19  --   --   --   --   ALKPHOS 92  --  83  --   --  72  --   --  70  --   --   --   --   BILITOT 0.3  --  0.7  --   --  0.6  --   --  0.5  --   --   --   --   < > = values in this interval not displayed. ------------------------------------------------------------------------------------------------------------------ estimated creatinine clearance is 31.5 mL/min (by C-G formula based on Cr of  0.95). ------------------------------------------------------------------------------------------------------------------ No results for input(s): HGBA1C in the last 72 hours. ------------------------------------------------------------------------------------------------------------------ No results for input(s): CHOL, HDL, LDLCALC, TRIG, CHOLHDL, LDLDIRECT in the last 72 hours. ------------------------------------------------------------------------------------------------------------------ No results for input(s): TSH, T4TOTAL, T3FREE, THYROIDAB in the last 72 hours.  Invalid input(s): FREET3 ------------------------------------------------------------------------------------------------------------------ No results for input(s): VITAMINB12, FOLATE, FERRITIN, TIBC, IRON, RETICCTPCT in the last 72 hours.  Coagulation profile No results for input(s): INR, PROTIME in the last 168 hours.  No results for input(s): DDIMER in the last 72 hours.  Cardiac Enzymes No results for input(s): CKMB, TROPONINI, MYOGLOBIN in the last 168 hours.  Invalid input(s): CK ------------------------------------------------------------------------------------------------------------------ Invalid input(s): POCBNP   Time Spent in minutes  35   Dnyla Antonetti K M.D on 11/14/2015 at 10:58 AM  Between 7am to 7pm - Pager - [phone]  After 7pm go to [email or web address] - password [facility]  [facility] -  Office  [phone]

## 2015-11-15 ENCOUNTER — Encounter (HOSPITAL_COMMUNITY): Payer: Self-pay

## 2015-11-15 DIAGNOSIS — Z7189 Other specified counseling: Secondary | ICD-10-CM

## 2015-11-15 DIAGNOSIS — Z515 Encounter for palliative care: Secondary | ICD-10-CM

## 2015-11-15 DIAGNOSIS — K117 Disturbances of salivary secretion: Secondary | ICD-10-CM

## 2015-11-15 LAB — GLUCOSE, CAPILLARY
Glucose-Capillary: 63 mg/dL — ABNORMAL LOW (ref 65–99)
Glucose-Capillary: 134 mg/dL — ABNORMAL HIGH (ref 65–99)

## 2015-11-15 MED ORDER — SCOPOLAMINE 1 MG/3DAYS TD PT72
1.0000 | MEDICATED_PATCH | TRANSDERMAL | Status: DC
  Administered 2015-11-15: 1.5 mg via TRANSDERMAL
  Filled 2015-11-15: qty 1

## 2015-11-15 MED ORDER — DEXTROSE 50 % IV SOLN
INTRAVENOUS | Status: AC
  Administered 2015-11-15: 0.5 mL
  Filled 2015-11-15: qty 50

## 2015-11-15 NOTE — Consult Note (Signed)
[place] Liaison: Received request from [place] for family interest in [facility]. Chart reviewed and met with family at length to confirm interest and offer support. Family agreeable to transfer to [facility], Sunday. Dr. Orpah Melter to assume care per family request. CSW, RN and Charge RN aware of plan to transfer tomorrow.   Please fax discharge summary to [phone].  RN please call report to [phone].  Please arrange transport for patient to arrive before noon if possible.   Thank you.  Erling Conte, [place]

## 2015-11-15 NOTE — Progress Notes (Signed)
Patient Demographics:    Nicolet Bianca, is a 81 y.o. female, DOB - 08/03/1932, WUJ:[id number]  Admit date - 11/07/2015   Admitting Physician Roslynn AmbleJennings E Nestor, MD  Outpatient Primary MD for the patient is Pamelia HoitWILSON,FRED HENRY, MD  LOS - 8  Summary  Freida BusmanJo Ann Ralphs is a 81 y.o. that presented to ED via EMS after she was found to be hypoxemic at her PCP's office. She required emergent intubation once she arrived to ED after vomiting. There was concern for aspiration. Of note, patient hospitalized in 2012, at which time she was intubated. PMH significant for Parkinson's disease, depression, GERD, HTN. Upon my evaluation, patient saturating 100% on vent. She was hypothermic to 2693 F and clinically had sepsis.  She was intubated and kept in ICU by pulmonary critical care, she was treated with antibiotics, subsequently extubated antibiotics tapered down and transferred to hospitalist service under my care on 11/12/2015. She continues to have a very poor cough reflex, has initially failed speech evaluation, family initially wanted to continue aggressive measures and after multiple counseling sessions they have now agreed for DO NOT RESUSCITATE and full palliative care.   Chief Complaint  Patient presents with  . Altered Mental Status        Subjective:    Clydene FakeJo Feuerborn today has, No headache, No chest pain, No abdominal pain - No Nausea, No new weakness tingling or numbness, mild cough but no shortness of breath. Appears more tired and fatigued.   Assessment  & Plan :    1. Acute on chronic hypoxic respiratory failure due to aspiration pneumonia causing toxic encephalopathy. Initially required intubation and admission to ICU by pulmonary critical care, treated with appropriate antibiotics currently on Unasyn and stop  date 11/13/2015. Cultures suggestive of rhinovirus but no bacterial agents are positive thus far. Continue oxygen and supportive care, speech therapy following. Once Unasyn has stopped after today we will monitor clinically likely will very deteriorate fast.  Remains to have a very poor cough reflex and appears to be extremely frail and cachectic which increases her future chances of aspiration and respiratory failure. Extremely poor and he did for CPR and future intubation, the per discussions were made between me, pulmonary critical care in family. Family is now agreeable for DO NOT RESUSCITATE and full palliative care/comfort care. Palliative care has been consulted. Now focus of care will be keeping her comfortable. All aggressive measures and medications will be stopped.   2. History of hypertension and chronic left bundle branch block. Placed on Coreg & hydralazine, also added as needed IV hydralazine.   3. History of Parkinson's disease, bedbound status with generalized weakness and cachexia. Continue carbidopa levodopa combination along with supportive care.   4. Anemia of chronic disease. Stable monitor.   5. GERD. PPI IV for now as nothing by mouth.    Code Status : DO NOT RESUSCITATE  Family Communication  : Daughter and son bedside  Disposition Plan  : Residential hospice versus home hospice in 1-2 days  Consults  :  Pulmonary critical care, speech, palliative care.  Procedures  :   11/11: Intubated, admitted to ICU 11/12 decreased sedation 11/13 Extubated 11/14 Made DNR/DNI  CT head no acute abnormality.  Echo in 2015 grade 1  diastolic dysfunction with EF of 50%.   DVT Prophylaxis  :    Heparin    Lab Results  Component Value Date   PLT 197 11/14/2015    Inpatient Medications  Scheduled Meds: . antiseptic oral rinse  7 mL Mouth Rinse QID  . carbidopa-levodopa  1 tablet Per Tube BID  . carvedilol  3.125 mg Oral BID WC  . chlorhexidine gluconate  15 mL  Mouth Rinse BID  . pantoprazole (PROTONIX) IV  40 mg Intravenous Q24H   Continuous Infusions:   PRN Meds:.  Antibiotics  :     Anti-infectives    Start     Dose/Rate Route Frequency Ordered Stop   11/10/15 1600  Ampicillin-Sulbactam (UNASYN) 3 g in sodium chloride 0.9 % 100 mL IVPB     3 g 100 mL/hr over 60 Minutes Intravenous Every 6 hours 11/10/15 1147 11/13/15 2244   11/08/15 1800  vancomycin (VANCOCIN) IVPB 750 mg/150 ml premix  Status:  Discontinued     750 mg 150 mL/hr over 60 Minutes Intravenous Every 24 hours 11/07/15 2031 11/10/15 1143   11/08/15 0000  piperacillin-tazobactam (ZOSYN) IVPB 3.375 g  Status:  Discontinued     3.375 g 12.5 mL/hr over 240 Minutes Intravenous Every 8 hours 11/07/15 2030 11/07/15 2032   11/08/15 0000  piperacillin-tazobactam (ZOSYN) IVPB 3.375 g  Status:  Discontinued     3.375 g 12.5 mL/hr over 240 Minutes Intravenous Every 8 hours 11/07/15 2032 11/10/15 1143   11/07/15 2200  levofloxacin (LEVAQUIN) IVPB 750 mg  Status:  Discontinued     750 mg 100 mL/hr over 90 Minutes Intravenous Every 24 hours 11/07/15 2118 11/07/15 2132   11/07/15 2145  doxycycline (VIBRAMYCIN) 100 mg in dextrose 5 % 250 mL IVPB  Status:  Discontinued     100 mg 125 mL/hr over 120 Minutes Intravenous Every 12 hours 11/07/15 2132 11/10/15 1143   11/07/15 2115  levofloxacin (LEVAQUIN) tablet 750 mg  Status:  Discontinued     750 mg Oral Daily 11/07/15 2101 11/07/15 2118   11/07/15 1745  vancomycin (VANCOCIN) IVPB 1000 mg/200 mL premix     1,000 mg 200 mL/hr over 60 Minutes Intravenous  Once 11/07/15 1734 11/07/15 1902   11/07/15 1745  piperacillin-tazobactam (ZOSYN) IVPB 3.375 g     3.375 g 100 mL/hr over 30 Minutes Intravenous  Once 11/07/15 1734 11/07/15 1829        Objective:   Filed Vitals:   11/15/15 0515 11/15/15 0532 11/15/15 0614 11/15/15 0644  BP: 190/58 190/58 131/80   Pulse: 61  72   Temp: 98.5 F (36.9 C)     TempSrc:      Resp: 12     Height:        Weight:    45.269 kg (99 lb 12.8 oz)  SpO2: 98%       Wt Readings from Last 3 Encounters:  11/15/15 45.269 kg (99 lb 12.8 oz)  07/27/14 47.945 kg (105 lb 11.2 oz)     Intake/Output Summary (Last 24 hours) at 11/15/15 0956 Last data filed at 11/14/15 1617  Gross per 24 hour  Intake      0 ml  Output      0 ml  Net      0 ml     Physical Exam  Frail elderly cachectic Caucasian female lying in hospital bed appears to have chronic contractures and severe deconditioning, with No new F.N deficits, flat affect [place].AT,PERRAL Supple Neck,No JVD,  No cervical lymphadenopathy appriciated.  Symmetrical Chest wall movement, Good air movement bilaterally, coarse bilateral breath sounds RRR,No Gallops,Rubs or new Murmurs, No Parasternal Heave +ve B.Sounds, Abd Soft, No tenderness, No organomegaly appriciated, No rebound - guarding or rigidity. No Cyanosis, Clubbing or edema, No new Rash or bruise       Data Review:   Micro Results Recent Results (from the past 240 hour(s))  Blood Culture (routine x 2)     Status: None   Collection Time: 11/07/15  5:45 PM  Result Value Ref Range Status   Specimen Description BLOOD LEFT HAND  Final   Special Requests BOTTLES DRAWN AEROBIC AND ANAEROBIC 5CC  Final   Culture NO GROWTH 5 DAYS  Final   Report Status 11/12/2015 FINAL  Final  Blood Culture (routine x 2)     Status: None   Collection Time: 11/07/15  5:52 PM  Result Value Ref Range Status   Specimen Description BLOOD RIGHT HAND  Final   Special Requests BOTTLES DRAWN AEROBIC AND ANAEROBIC 5CC  Final   Culture NO GROWTH 5 DAYS  Final   Report Status 11/12/2015 FINAL  Final  Urine culture     Status: None   Collection Time: 11/07/15  5:56 PM  Result Value Ref Range Status   Specimen Description URINE, CATHETERIZED  Final   Special Requests NONE  Final   Culture 2,000 COLONIES/mL INSIGNIFICANT GROWTH  Final   Report Status 11/08/2015 FINAL  Final  MRSA PCR Screening     Status: Abnormal    Collection Time: 11/07/15  9:31 PM  Result Value Ref Range Status   MRSA by PCR INVALID RESULTS, SPECIMEN SENT FOR CULTURE (A) NEGATIVE Final    Comment:        The GeneXpert MRSA Assay (FDA approved for NASAL specimens only), is one component of a comprehensive MRSA colonization surveillance program. It is not intended to diagnose MRSA infection nor to guide or monitor treatment for MRSA infections. RESULT CALLED TO, READ BACK BY AND VERIFIED WITH: GREG @0113  11/08/15 MKELLY   MRSA culture     Status: None   Collection Time: 11/07/15  9:31 PM  Result Value Ref Range Status   Specimen Description NASAL SWAB  Final   Special Requests NONE  Final   Culture NOMRSA Performed at [facility]   Final   Report Status 11/10/2015 FINAL  Final  Culture, respiratory (NON-Expectorated)     Status: None   Collection Time: 11/07/15 10:38 PM  Result Value Ref Range Status   Specimen Description TRACHEAL ASPIRATE  Final   Special Requests NONE  Final   Gram Stain   Final    ABUNDANT WBC PRESENT,BOTH PMN AND MONONUCLEAR RARE SQUAMOUS EPITHELIAL CELLS PRESENT FEW GRAM POSITIVE COCCI IN PAIRS RARE GRAM NEGATIVE RODS Performed at [facility]    Culture   Final    Non-Pathogenic Oropharyngeal-type Flora Isolated. Performed at [facility]    Report Status 11/10/2015 FINAL  Final  Respiratory virus panel     Status: Abnormal   Collection Time: 11/07/15 10:56 PM  Result Value Ref Range Status   Respiratory Syncytial Virus A Negative Negative Final   Respiratory Syncytial Virus B Negative Negative Final   Influenza A Negative Negative Final   Influenza B Negative Negative Final   Parainfluenza 1 Negative Negative Final   Parainfluenza 2 Negative Negative Final   Parainfluenza 3 Negative Negative Final   Metapneumovirus Negative Negative Final   Rhinovirus Positive (A)  Negative Final   Adenovirus Negative Negative Final    Comment: (NOTE) Performed At: [facility] [address] [place], [place] [id number] Mila Homer MD IO:[id number]     Radiology Reports   Ct Head Wo Contrast  11/07/2015  CLINICAL DATA:  Per family altered mental status and patient's mental status declined in transport. Pt vomited x 1 today. EXAM: CT HEAD WITHOUT CONTRAST TECHNIQUE: Contiguous axial images were obtained from the base of the skull through the vertex without intravenous contrast. COMPARISON:  07/27/2014 FINDINGS: There is significant central and cortical atrophy. Severe periventricular white matter changes are consistent with small vessel disease and appear chronic. There is no intra or extra-axial fluid collection or mass lesion. The basilar cisterns and ventricles have a normal appearance. There is no CT evidence for acute infarction or hemorrhage. IMPRESSION: No evidence for acute intracranial abnormality. Significant atrophy and small vessel disease. Electronically Signed   By: Norva Pavlov M.D.   On: 11/07/2015 18:29   Dg Chest Port 1 View  11/13/2015  CLINICAL DATA:  ? Aspiration Pneumonia; Patients head fixed to the right EXAM: PORTABLE CHEST 1 VIEW COMPARISON:  11/12/2015 FINDINGS: Heart size is obscured by bibasilar opacities. There are bilateral pleural effusions and bilateral lower lobe atelectasis or infiltrates, stable in appearance. The patient's chin obscures or right lung apex. IMPRESSION: Little interval change in significant bilateral lower lobe opacities and effusions. Electronically Signed   By: Norva Pavlov M.D.   On: 11/13/2015 08:52   Dg Chest Port 1 View  11/12/2015  CLINICAL DATA:  Shortness of breath. EXAM: PORTABLE CHEST 1 VIEW COMPARISON:  11/09/2015, 11/07/2015 and 07/11/2015 FINDINGS: Endotracheal tube and NG tube have been removed. The patient has developed moderate bilateral pleural effusions. There is slight bilateral perihilar edema. No other change. IMPRESSION: Interval development of moderate bilateral  pleural effusions and slight perihilar pulmonary edema. Electronically Signed   By: Francene Boyers M.D.   On: 11/12/2015 08:27   Dg Chest Port 1 View  11/09/2015  CLINICAL DATA:  81 year old female with pneumonia. EXAM: PORTABLE CHEST 1 VIEW COMPARISON:  11/07/2015 and prior chest radiographs FINDINGS: Cardiomediastinal silhouette is unchanged. Decreased left lung pulmonary vascular congestion noted. An endotracheal tube is identified with tip at the carina -recommend 2-3 cm retraction. Elevation of the right hemidiaphragm and bibasilar atelectasis/airspace disease again noted. There is no evidence of pneumothorax. An NG tube is identified with tip overlying the pre-pyloric region. IMPRESSION: Endotracheal tube with tip at the carina -recommend 2-3 cm retraction. Delsa Sale, nurse with this patient, was notified. Decreased left sided pulmonary vascular congestion. No other significant changes. Electronically Signed   By: Harmon Pier M.D.   On: 11/09/2015 07:37   Dg Chest Portable 1 View  11/07/2015  CLINICAL DATA:  Endotracheal tube placement. EXAM: PORTABLE CHEST 1 VIEW COMPARISON:  11/07/2015 and 07/11/2015 FINDINGS: Patient is moderately rotated to the right. Endotracheal tube has tip approximately 1.6 cm above the carina along the anterior tracheal wall. Nasogastric tube courses into the region of the stomach and off the inferior portion of the film. There is moderate elevation right hemidiaphragm unchanged. Hazy opacification of the left base likely combination of small effusion with atelectasis. There is prominence of the perihilar markings likely mild degree of vascular congestion. Remainder of the exam is unchanged. IMPRESSION: Findings suggesting mild vascular congestion. Left base opacification likely small effusion with associated atelectasis although cannot exclude infection. Moderate stable elevation of the right hemidiaphragm. Tubes and lines as described.  Note that the endotracheal tube has tip  approximately 1.6 cm above the carina. Electronically Signed   By: Elberta Fortis M.D.   On: 11/07/2015 19:19   Dg Chest Portable 1 View  11/07/2015  CLINICAL DATA:  Shortness of breath.  Concern for aspiration. EXAM: PORTABLE CHEST 1 VIEW COMPARISON:  07/11/2015 FINDINGS: There is opacity at the left lung base which obscures most of the left hemidiaphragm. This may be atelectasis. Pneumonia or aspiration pneumonitis is possible. There may be a small associated pleural effusion. Remainder of the lungs is clear. Right hemidiaphragm is elevated, stable from prior study. No pneumothorax. There cardiac silhouette is partly obscured. Is grossly normal in size. No mediastinal or hilar masses are evident. Bony thorax is demineralized. Intra medullary rod noted in the right humerus reducing an old, healed proximal shaft fracture. IMPRESSION: 1. Left lung base opacity which is consistent with pneumonia, aspiration pneumonitis, atelectasis or a combination. Possible associated small effusion. No other acute finding. Electronically Signed   By: Amie Portland M.D.   On: 11/07/2015 17:38     CBC  Recent Labs Lab 11/09/15 0202 11/10/15 0211 11/11/15 0224 11/14/15 0705  WBC 7.8 7.4 6.8 9.4  HGB 7.6* 8.0* 8.0* 8.8*  HCT 24.5* 26.0* 26.7* 28.5*  PLT 137* 140* 120* 197  MCV 78.5 79.8 81.2 81.0  MCH 24.4* 24.5* 24.3* 25.0*  MCHC 31.0 30.8 30.0 30.9  RDW 19.1* 19.3* 19.4* 20.6*    Chemistries   Recent Labs Lab 11/08/15 1320 11/08/15 2218 11/09/15 0202 11/09/15 1050 11/09/15 2243 11/10/15 0211 11/10/15 1113 11/10/15 2250 11/11/15 0224 11/14/15 0705  NA 135  --  137  --   --  140  --   --  142 141  K 4.6  --  4.1  --   --  4.1  --   --  4.0 3.3*  CL 103  --  103  --   --  106  --   --  106 101  CO2 27  --  28  --   --  29  --   --  30 31  GLUCOSE 74  --  98  --   --  105*  --   --  105* 97  BUN 23*  --  27*  --   --  24*  --   --  15 18  CREATININE 0.93  --  0.91  --   --  0.78  --   --  0.80  0.95  CALCIUM 8.0*  --  8.0*  --   --  8.2*  --   --  8.5* 8.6*  MG 1.5* 2.9*  --  2.3 2.1  --  1.9 1.8  --   --   AST  --   --  40  --   --  30  --   --   --   --   ALT  --   --  7*  --   --  19  --   --   --   --   ALKPHOS  --   --  72  --   --  70  --   --   --   --   BILITOT  --   --  0.6  --   --  0.5  --   --   --   --    ------------------------------------------------------------------------------------------------------------------ estimated creatinine clearance is 33.2 mL/min (by C-G formula based on Cr  of 0.95). ------------------------------------------------------------------------------------------------------------------ No results for input(s): HGBA1C in the last 72 hours. ------------------------------------------------------------------------------------------------------------------ No results for input(s): CHOL, HDL, LDLCALC, TRIG, CHOLHDL, LDLDIRECT in the last 72 hours. ------------------------------------------------------------------------------------------------------------------ No results for input(s): TSH, T4TOTAL, T3FREE, THYROIDAB in the last 72 hours.  Invalid input(s): FREET3 ------------------------------------------------------------------------------------------------------------------ No results for input(s): VITAMINB12, FOLATE, FERRITIN, TIBC, IRON, RETICCTPCT in the last 72 hours.  Coagulation profile No results for input(s): INR, PROTIME in the last 168 hours.  No results for input(s): DDIMER in the last 72 hours.  Cardiac Enzymes No results for input(s): CKMB, TROPONINI, MYOGLOBIN in the last 168 hours.  Invalid input(s): CK ------------------------------------------------------------------------------------------------------------------ Invalid input(s): POCBNP   Time Spent in minutes  35   Melanie Pellot K M.D on 11/15/2015 at 9:56 AM  Between 7am to 7pm - Pager - [phone]  After 7pm go to [email or web address] - password [email or web address]  [facility] -  Office  [phone]

## 2015-11-15 NOTE — Consult Note (Signed)
Consultation Note Date: 11/15/2015   Patient Name: Shawna Torres  DOB: 05-19-1932  MRN: [id number]  Age / Sex: 81 y.o., female  PCP: Barbie Banner, MD Referring Physician: Leroy Sea, MD  Reason for Consultation: Establishing goals of care    Clinical Assessment/Narrative: Shawna Torres is a 81 y.o. with a past medical history significant for Parkinson's disease that was initially diagnosed in 56. Patient has been at home with her daughter and a paid caregiver. The patient's care has been at home she has essentially been bedbound at baseline and also nonverbal at baseline. Patient's son and daughter have taken care off her. Patient was admitted with acute hypoxic respiratory failure, possible aspiration pneumonia. She was intubated and was in the ICU she was subsequently extubated and transferred to hospitalist service. Hospitalization course has been complicated by inability to manage secretions, poor cough reflex, failed a swallow evaluation, ongoing dysphasia. Multiple conversations were held between the primary team and the patient's son and daughter. Ultimately palliative consultation has been requested for further goals of care discussions  Patient is resting in bed. She is awake but not entirely alert. She is nonverbal which is essentially her baseline. She has increasing secretions that she is unable to manage. Son and daughter are present at the bedside. Brief life review performed. CODE STATUS DO NOT RESUSCITATE/DO NOT INTUBATE rediscussed and approved upon. Discussed about hospice. Family stated they're familiar with [facility] of [facility]. Patient's sister lives 2 blocks away from [facility]. Will request for residential hospice placement.   [facility] for the consult. Social worker on 5 west floor  has been contacted. Hospice consult. Transfer to [facility] when bed available.    Contacts/Participants in  Discussion: Primary Decision Maker:  Patient's son and daughter    Relationship to Patient  HCPOA: yes     SUMMARY OF RECOMMENDATIONS: DO NOT RESUSCITATE/DO NOT INTUBATE Hospice consult Add scopolamine patch for managing secretions Transfer to hosp[facility] when bed available   Code Status/Advance Care Planning: DNR    Code Status Orders        Start     Ordered   11/14/15 1659  Do not attempt resuscitation (DNR)   Continuous    Question Answer Comment  In the event of cardiac or respiratory ARREST Do not call a "code blue"   In the event of cardiac or respiratory ARREST Do not perform Intubation, CPR, defibrillation or ACLS   In the event of cardiac or respiratory ARREST Use medication by any route, position, wound care, and other measures to relive pain and suffering. May use oxygen, suction and manual treatment of airway obstruction as needed for comfort.      11/14/15 1659      Other Directives:Other  Symptom Management:    add scopolamine patch for secretions   Palliative Prophylaxis:   Bowel Regimen  Additional Recommendations (Limitations, Scope, Preferences):  Full Comfort Care     Psycho-social/Spiritual:  Support System: Strong Desire for further Chaplaincy support:no Additional Recommendations: Education on Hospice  Prognosis: < 2 weeks  Discharge Planning: Hospice facility   Chief Complaint/ Primary Diagnoses: Present on Admission:  . Respiratory failure (HCC) . Pneumonia  I have reviewed the medical record, interviewed the patient and family, and examined the patient. The following aspects are pertinent.  Past Medical History  Diagnosis Date  . Parkinson disease (HCC)   . Dementia    Social History   Social History  . Marital Status: Widowed  Spouse Name: N/A  . Number of Children: N/A  . Years of Education: N/A   Social History Main Topics  . Smoking status: Never Smoker   . Smokeless tobacco: None  . Alcohol Use:  No  . Drug Use: None  . Sexual Activity: Not Asked   Other Topics Concern  . None   Social History Narrative   History reviewed. No pertinent family history. Scheduled Meds: . antiseptic oral rinse  7 mL Mouth Rinse QID  . carbidopa-levodopa  1 tablet Per Tube BID  . carvedilol  3.125 mg Oral BID WC  . chlorhexidine gluconate  15 mL Mouth Rinse BID  . pantoprazole (PROTONIX) IV  40 mg Intravenous Q24H  . scopolamine  1 patch Transdermal Q72H   Continuous Infusions:  PRN Meds:.acetaminophen **OR** acetaminophen, antiseptic oral rinse, bisacodyl, glycopyrrolate **OR** glycopyrrolate **OR** glycopyrrolate, haloperidol **OR** haloperidol **OR** haloperidol lactate, hydrALAZINE, morphine injection, ondansetron **OR** ondansetron (ZOFRAN) IV, polyvinyl alcohol, RESOURCE THICKENUP CLEAR Medications Prior to Admission:  Prior to Admission medications   Medication Sig Start Date End Date Taking? Authorizing Provider  escitalopram (LEXAPRO) 20 MG tablet Take 20 mg by mouth daily.   Yes Historical Provider, MD  carbidopa-levodopa (PARCOPA) 25-100 MG per disintegrating tablet Take 1-2 tablets by mouth 2 (two) times daily. Patients takes 2 tablets in the morning. 1 tablets in afternoon. 2 tablets in the evening,    Historical Provider, MD  Multiple Vitamin (MULTIVITAMIN WITH MINERALS) TABS Take 2 tablets by mouth daily.    Historical Provider, MD  omeprazole (PRILOSEC) 20 MG capsule Take 20 mg by mouth daily.    Historical Provider, MD  vitamin B-12 (CYANOCOBALAMIN) 1000 MCG tablet Take 1,000 mcg by mouth daily.    Historical Provider, MD   Allergies  Allergen Reactions  . Depakote [Divalproex Sodium] Other (See Comments)    Thrombocytopenia    Review of Systems Patient non verbal  Physical Exam Weak frail non verbal Increased secretions Coarse breath sounds anteriorly S1s2 TRACE EDEMA  Vital Signs: BP 161/52 mmHg  Pulse 57  Temp(Src) 98.6 F (37 C) (Axillary)  Resp 12  Ht 5\' 2"   (1.575 m)  Wt 45.269 kg (99 lb 12.8 oz)  BMI 18.25 kg/m2  SpO2 98%  SpO2: SpO2: 98 % O2 Device:SpO2: 98 % O2 Flow Rate: .O2 Flow Rate (L/min): 1 L/min  IO: Intake/output summary:  Intake/Output Summary (Last 24 hours) at 11/15/15 1439 Last data filed at 11/15/15 1439  Gross per 24 hour  Intake      0 ml  Output      0 ml  Net      0 ml    LBM: Last BM Date: 11/13/15 Baseline Weight: Weight: 47.628 kg (105 lb) Most recent weight: Weight: 45.269 kg (99 lb 12.8 oz)      Palliative Assessment/Data:  Flowsheet Rows        Most Recent Value   Intake Tab    Referral Department  Hospitalist   Unit at Time of Referral  Med/Surg Unit   Palliative Care Type  New Palliative care   Clinical Assessment    Palliative Performance Scale Score  10%   Psychosocial & Spiritual Assessment    Palliative Care Outcomes       Additional Data Reviewed:  CBC:    Component Value Date/Time   WBC 9.4 11/14/2015 0705   HGB 8.8* 11/14/2015 0705   HCT 28.5* 11/14/2015 0705   PLT 197 11/14/2015 0705   MCV 81.0 11/14/2015 0705  NEUTROABS 16.5* 11/08/2015 0240   LYMPHSABS 0.3* 11/08/2015 0240   MONOABS 0.6 11/08/2015 0240   EOSABS 0.0 11/08/2015 0240   BASOSABS 0.0 11/08/2015 0240   Comprehensive Metabolic Panel:    Component Value Date/Time   NA 141 11/14/2015 0705   K 3.3* 11/14/2015 0705   CL 101 11/14/2015 0705   CO2 31 11/14/2015 0705   BUN 18 11/14/2015 0705   CREATININE 0.95 11/14/2015 0705   GLUCOSE 97 11/14/2015 0705   CALCIUM 8.6* 11/14/2015 0705   AST 30 11/10/2015 0211   ALT 19 11/10/2015 0211   ALKPHOS 70 11/10/2015 0211   BILITOT 0.5 11/10/2015 0211   PROT 5.5* 11/10/2015 0211   ALBUMIN 2.7* 11/10/2015 0211     Time In:  1300 Time Out: 1400 Time Total: 60 min  Greater than 50%  of this time was spent counseling and coordinating care related to the above assessment and plan.  Signed by: Rosalin Hawking, MD [phone] Rosalin Hawking, MD  11/15/2015, 2:39 PM  Please  contact Palliative Medicine Team phone at [phone] for questions and concerns.

## 2015-11-15 NOTE — Progress Notes (Signed)
Late entry for 0931 Hypoglycemic Event  CBG: 63  Treatment: D50 IV 25 mL  Symptoms: None  Follow-up CBG: Time:1012 CBG Result:134  Possible Reasons for Event: Inadequate meal intake; unable to tolerate po food/meds; end of life care   Comments/MD notified: Dr. Thedore MinsSingh

## 2015-11-15 NOTE — Progress Notes (Signed)
Pt BP 190/58 @ 5:15 am, rechecked @ 5:32 am, still 190/58. PRN hydralazine given. Pt seemed to be showing signs of pain and discomfort so PRN morphine was also given. BP @ 6:15 am 131/80. Will continue to monitor.

## 2015-11-16 MED ORDER — LORAZEPAM 2 MG/ML PO CONC: 1.0000 mg | Freq: Four times a day (QID) | ORAL | Status: AC | PRN

## 2015-11-16 MED ORDER — MORPHINE SULFATE (CONCENTRATE) 10 MG/0.5ML PO SOLN: 10.0000 mg | ORAL | Status: AC | PRN

## 2015-11-16 NOTE — Discharge Instructions (Signed)
Follow with Primary MD Pamelia HoitWILSON,FRED HENRY, MD as desired, goal of care is comfort she residential hospice status.  Activity: As tolerated with Full fall precautions use walker/cane & assistance as needed   Disposition residential hospice   Diet:  Dysphagia 3 diet with nectar thick liquids, full feeding assistance and aspiration precautions.

## 2015-11-16 NOTE — Progress Notes (Signed)
Pt prepared for d/c to pallative care at [facility]. IV d/c'd. Skin intact except as charted in most recent assessments. Vitals are stable. Report called to receiving facility. Pt to be transported by ambulance service. Family at bedside.

## 2015-11-16 NOTE — Discharge Summary (Signed)
Shawna Torres, is a 81 y.o. female  DOB [birth year]  MRN [id number].  Admission date:  11/07/2015  Admitting Physician  Roslynn Amble, MD  Discharge Date:  11/16/2015   Primary MD  Pamelia Hoit, MD  Recommendations for primary care physician for things to follow:    Goal of care is comfort. Status residential hospice. No further heroics.   Admission Diagnosis  SOB, Aspiration   Discharge Diagnosis  SOB, Aspiration     Active Problems:   Respiratory failure (HCC)   Pneumonia   Anemia due to other cause   Lobar pneumonia (HCC)   Aspiration pneumonia of lower lobe (HCC)   Rhinovirus infection   Acute respiratory failure with hypoxia (HCC)   Increased oropharyngeal secretions   Encounter for palliative care   Goals of care, counseling/discussion      Past Medical History  Diagnosis Date  . Parkinson disease (HCC)   . Dementia     History reviewed. No pertinent past surgical history.     HPI  from the history and physical done on the day of admission:   Shawna Torres is a 81 y.o. that presented to ED via EMS after she was found to be hypoxemic at her PCP's office. She required emergent intubation once she arrived to ED after vomiting. There was concern for aspiration. Of note, patient hospitalized in 2012, at which time she was intubated. PMH significant for Parkinson's disease, depression, GERD, HTN. Upon my evaluation, patient saturating 100% on vent. She was hypothermic to 66 F and clinically had sepsis.   She was intubated and kept in ICU by pulmonary critical care, she was treated with antibiotics, subsequently extubated antibiotics tapered down and transferred to hospitalist service under my care on 11/12/2015. She continues to have a very poor cough reflex, has initially failed speech  evaluation, family initially wanted to continue aggressive measures and after multiple counseling sessions they have now agreed for DO NOT RESUSCITATE and full palliative care.     Hospital Course:     1. Acute on chronic hypoxic respiratory failure due to aspiration pneumonia causing toxic encephalopathy. Initially required intubation and admission to ICU by pulmonary critical care, treated with appropriate antibiotics currently on Unasyn and stop date 11/13/2015. Cultures suggestive of rhinovirus but no bacterial agents are positive thus far. Continue oxygen and supportive care, high risk for aspiration, dysphagia 3 diet and nectar thick liquids for comfort feeds with full feeding assistance and aspiration precautions.  Remains to have a very poor cough reflex and appears to be extremely frail and cachectic which increases her future chances of aspiration and respiratory failure. Extremely poor and gait for CPR and future intubation, multiple discussions were made between me, pulmonary critical care in family. Family is now agreeable for DO NOT RESUSCITATE and full palliative care/comfort care. Palliative care has been consulted. Now focus of care will be keeping her comfortable. All aggressive measures and medications will be stopped. She will be discharged to residential hospice with comfort measures.  2. History of hypertension and chronic left bundle branch block. Stop all heart medications: Comfort.   3. History of Parkinson's disease, bedbound status with generalized weakness and cachexia at baseline. Continue carbidopa levodopa combination along with supportive care.   4. Anemia of chronic disease. Stable monitor.   5. GERD. Comfort medications only.    Discharge Condition:  Guarded  Follow UP  Follow-up Information    Follow up with Pamelia HoitWILSON,FRED HENRY, MD.   Specialty:  Family Medicine   Why:  As needed   Contact information:   [address] [place]  [id number] [phone]        Consults obtained -  Pulmonary critical care, speech, palliative care.  Diet and Activity recommendation: See Discharge Instructions below  Discharge Instructions       Discharge Instructions    Discharge instructions    Complete by:  As directed   Follow with Primary MD Pamelia HoitWILSON,FRED HENRY, MD as desired, goal of care is comfort she residential hospice status.  Activity: As tolerated with Full fall precautions use walker/cane & assistance as needed   Disposition residential hospice   Diet:  Dysphagia 3 diet with nectar thick liquids, full feeding assistance and aspiration precautions.     Increase activity slowly    Complete by:  As directed              Discharge Medications       Medication List    STOP taking these medications        multivitamin with minerals Tabs tablet     vitamin B-12 1000 MCG tablet  Commonly known as:  CYANOCOBALAMIN      TAKE these medications        carbidopa-levodopa 25-100 MG disintegrating tablet  Commonly known as:  PARCOPA  Take 1-2 tablets by mouth 2 (two) times daily. Patients takes 2 tablets in the morning. 1 tablets in afternoon. 2 tablets in the evening,     escitalopram 20 MG tablet  Commonly known as:  LEXAPRO  Take 20 mg by mouth daily.     LORazepam 2 MG/ML concentrated solution  Commonly known as:  ATIVAN  Take 0.5 mLs (1 mg total) by mouth every 6 (six) hours as needed for anxiety.     morphine CONCENTRATE 10 MG/0.5ML Soln concentrated solution  Take 0.5 mLs (10 mg total) by mouth every 3 (three) hours as needed for moderate pain or severe pain.     omeprazole 20 MG capsule  Commonly known as:  PRILOSEC  Take 20 mg by mouth daily.        Major procedures and Radiology Reports - PLEASE review detailed and final reports for all details, in brief -   11/11: Intubated, admitted to ICU 11/12 decreased sedation 11/13 Extubated 11/14 Made DNR/DNI  CT head no acute  abnormality.  Echo in 2015 grade 1 diastolic dysfunction with EF of 50%.    Ct Head Wo Contrast  11/07/2015  CLINICAL DATA:  Per family altered mental status and patient's mental status declined in transport. Pt vomited x 1 today. EXAM: CT HEAD WITHOUT CONTRAST TECHNIQUE: Contiguous axial images were obtained from the base of the skull through the vertex without intravenous contrast. COMPARISON:  07/27/2014 FINDINGS: There is significant central and cortical atrophy. Severe periventricular white matter changes are consistent with small vessel disease and appear chronic. There is no intra or extra-axial fluid collection or mass lesion. The basilar cisterns and ventricles have a normal appearance. There is  no CT evidence for acute infarction or hemorrhage. IMPRESSION: No evidence for acute intracranial abnormality. Significant atrophy and small vessel disease. Electronically Signed   By: Norva Pavlov M.D.   On: 11/07/2015 18:29   Dg Chest Port 1 View  11/13/2015  CLINICAL DATA:  ? Aspiration Pneumonia; Patients head fixed to the right EXAM: PORTABLE CHEST 1 VIEW COMPARISON:  11/12/2015 FINDINGS: Heart size is obscured by bibasilar opacities. There are bilateral pleural effusions and bilateral lower lobe atelectasis or infiltrates, stable in appearance. The patient's chin obscures or right lung apex. IMPRESSION: Little interval change in significant bilateral lower lobe opacities and effusions. Electronically Signed   By: Norva Pavlov M.D.   On: 11/13/2015 08:52   Dg Chest Port 1 View  11/12/2015  CLINICAL DATA:  Shortness of breath. EXAM: PORTABLE CHEST 1 VIEW COMPARISON:  11/09/2015, 11/07/2015 and 07/11/2015 FINDINGS: Endotracheal tube and NG tube have been removed. The patient has developed moderate bilateral pleural effusions. There is slight bilateral perihilar edema. No other change. IMPRESSION: Interval development of moderate bilateral pleural effusions and slight perihilar pulmonary  edema. Electronically Signed   By: Francene Boyers M.D.   On: 11/12/2015 08:27   Dg Chest Port 1 View  11/09/2015  CLINICAL DATA:  81 year old female with pneumonia. EXAM: PORTABLE CHEST 1 VIEW COMPARISON:  11/07/2015 and prior chest radiographs FINDINGS: Cardiomediastinal silhouette is unchanged. Decreased left lung pulmonary vascular congestion noted. An endotracheal tube is identified with tip at the carina -recommend 2-3 cm retraction. Elevation of the right hemidiaphragm and bibasilar atelectasis/airspace disease again noted. There is no evidence of pneumothorax. An NG tube is identified with tip overlying the pre-pyloric region. IMPRESSION: Endotracheal tube with tip at the carina -recommend 2-3 cm retraction. Delsa Sale, nurse with this patient, was notified. Decreased left sided pulmonary vascular congestion. No other significant changes. Electronically Signed   By: Harmon Pier M.D.   On: 11/09/2015 07:37   Dg Chest Portable 1 View  11/07/2015  CLINICAL DATA:  Endotracheal tube placement. EXAM: PORTABLE CHEST 1 VIEW COMPARISON:  11/07/2015 and 07/11/2015 FINDINGS: Patient is moderately rotated to the right. Endotracheal tube has tip approximately 1.6 cm above the carina along the anterior tracheal wall. Nasogastric tube courses into the region of the stomach and off the inferior portion of the film. There is moderate elevation right hemidiaphragm unchanged. Hazy opacification of the left base likely combination of small effusion with atelectasis. There is prominence of the perihilar markings likely mild degree of vascular congestion. Remainder of the exam is unchanged. IMPRESSION: Findings suggesting mild vascular congestion. Left base opacification likely small effusion with associated atelectasis although cannot exclude infection. Moderate stable elevation of the right hemidiaphragm. Tubes and lines as described. Note that the endotracheal tube has tip approximately 1.6 cm above the carina.  Electronically Signed   By: Elberta Fortis M.D.   On: 11/07/2015 19:19   Dg Chest Portable 1 View  11/07/2015  CLINICAL DATA:  Shortness of breath.  Concern for aspiration. EXAM: PORTABLE CHEST 1 VIEW COMPARISON:  07/11/2015 FINDINGS: There is opacity at the left lung base which obscures most of the left hemidiaphragm. This may be atelectasis. Pneumonia or aspiration pneumonitis is possible. There may be a small associated pleural effusion. Remainder of the lungs is clear. Right hemidiaphragm is elevated, stable from prior study. No pneumothorax. There cardiac silhouette is partly obscured. Is grossly normal in size. No mediastinal or hilar masses are evident. Bony thorax is demineralized. Intra medullary rod noted in the right  humerus reducing an old, healed proximal shaft fracture. IMPRESSION: 1. Left lung base opacity which is consistent with pneumonia, aspiration pneumonitis, atelectasis or a combination. Possible associated small effusion. No other acute finding. Electronically Signed   By: Amie Portland M.D.   On: 11/07/2015 17:38    Micro Results      Recent Results (from the past 240 hour(s))  Blood Culture (routine x 2)     Status: None   Collection Time: 11/07/15  5:45 PM  Result Value Ref Range Status   Specimen Description BLOOD LEFT HAND  Final   Special Requests BOTTLES DRAWN AEROBIC AND ANAEROBIC 5CC  Final   Culture NO GROWTH 5 DAYS  Final   Report Status 11/12/2015 FINAL  Final  Blood Culture (routine x 2)     Status: None   Collection Time: 11/07/15  5:52 PM  Result Value Ref Range Status   Specimen Description BLOOD RIGHT HAND  Final   Special Requests BOTTLES DRAWN AEROBIC AND ANAEROBIC 5CC  Final   Culture NO GROWTH 5 DAYS  Final   Report Status 11/12/2015 FINAL  Final  Urine culture     Status: None   Collection Time: 11/07/15  5:56 PM  Result Value Ref Range Status   Specimen Description URINE, CATHETERIZED  Final   Special Requests NONE  Final   Culture 2,000  COLONIES/mL INSIGNIFICANT GROWTH  Final   Report Status 11/08/2015 FINAL  Final  MRSA PCR Screening     Status: Abnormal   Collection Time: 11/07/15  9:31 PM  Result Value Ref Range Status   MRSA by PCR INVALID RESULTS, SPECIMEN SENT FOR CULTURE (A) NEGATIVE Final    Comment:        The GeneXpert MRSA Assay (FDA approved for NASAL specimens only), is one component of a comprehensive MRSA colonization surveillance program. It is not intended to diagnose MRSA infection nor to guide or monitor treatment for MRSA infections. RESULT CALLED TO, READ BACK BY AND VERIFIED WITH: GREG  11/08/15 MKELLY   MRSA culture     Status: None   Collection Time: 11/07/15  9:31 PM  Result Value Ref Range Status   Specimen Description NASAL SWAB  Final   Special Requests NONE  Final   Culture NOMRSA Performed at [facility]   Final   Report Status 11/10/2015 FINAL  Final  Culture, respiratory (NON-Expectorated)     Status: None   Collection Time: 11/07/15 10:38 PM  Result Value Ref Range Status   Specimen Description TRACHEAL ASPIRATE  Final   Special Requests NONE  Final   Gram Stain   Final    ABUNDANT WBC PRESENT,BOTH PMN AND MONONUCLEAR RARE SQUAMOUS EPITHELIAL CELLS PRESENT FEW GRAM POSITIVE COCCI IN PAIRS RARE GRAM NEGATIVE RODS Performed at [facility]    Culture   Final    Non-Pathogenic Oropharyngeal-type Flora Isolated. Performed at [facility]    Report Status 11/10/2015 FINAL  Final  Respiratory virus panel     Status: Abnormal   Collection Time: 11/07/15 10:56 PM  Result Value Ref Range Status   Respiratory Syncytial Virus A Negative Negative Final   Respiratory Syncytial Virus B Negative Negative Final   Influenza A Negative Negative Final   Influenza B Negative Negative Final   Parainfluenza 1 Negative Negative Final   Parainfluenza 2 Negative Negative Final   Parainfluenza 3 Negative Negative Final   Metapneumovirus Negative Negative  Final   Rhinovirus Positive (A) Negative Final  Adenovirus Negative Negative Final    Comment: (NOTE) Performed At: [facility] [address] [place], [place] [id number] Mila Homer MD WU:[id number]        Today   Subjective    Clydene Fake today in bed appears somnolent, does not answer questions reliably, appears tired and fatigued   Objective   Blood pressure 125/45, pulse 73, temperature 97.6 F (36.4 C), temperature source Oral, resp. rate 20, height 5\' 2"  (1.575 m), weight 47.446 kg (104 lb 9.6 oz), SpO2 100 %.   Intake/Output Summary (Last 24 hours) at 11/16/15 0843 Last data filed at 11/15/15 1439  Gross per 24 hour  Intake      0 ml  Output      0 ml  Net      0 ml    Exam Appears frail and cachectic, somnolent, unable to answer questions reliably, [place].AT,PERRAL Supple Neck,No JVD, No cervical lymphadenopathy appriciated.  Symmetrical Chest wall movement, moderate air movement bilaterally, coarse bilateral breath sounds RRR,No Gallops,Rubs or new Murmurs, No Parasternal Heave +ve B.Sounds, Abd Soft, Non tender, No organomegaly appriciated, No rebound -guarding or rigidity. No Cyanosis, Clubbing or edema, No new Rash or bruise   Data Review   CBC w Diff: Lab Results  Component Value Date   WBC 9.4 11/14/2015   HGB 8.8* 11/14/2015   HCT 28.5* 11/14/2015   PLT 197 11/14/2015   LYMPHOPCT 2 11/08/2015   MONOPCT 4 11/08/2015   EOSPCT 0 11/08/2015   BASOPCT 0 11/08/2015    CMP: Lab Results  Component Value Date   NA 141 11/14/2015   K 3.3* 11/14/2015   CL 101 11/14/2015   CO2 31 11/14/2015   BUN 18 11/14/2015   CREATININE 0.95 11/14/2015   PROT 5.5* 11/10/2015   ALBUMIN 2.7* 11/10/2015   BILITOT 0.5 11/10/2015   ALKPHOS 70 11/10/2015   AST 30 11/10/2015   ALT 19 11/10/2015  .   Total Time in preparing paper work, data evaluation and todays exam - 35 minutes  Leroy Sea M.D on 11/16/2015 at 8:43 AM  [facility]    Office  [phone]

## 2015-11-27 DEATH — deceased
